# Patient Record
Sex: Male | Born: 1970 | Race: White | Hispanic: No | Smoking: Never smoker
Health system: Southern US, Community
[De-identification: ages and names within clinical notes are randomized; demographics above are authoritative.]

## PROBLEM LIST (undated history)

## (undated) DIAGNOSIS — F419 Anxiety disorder, unspecified: Secondary | ICD-10-CM

## (undated) DIAGNOSIS — C439 Malignant melanoma of skin, unspecified: Secondary | ICD-10-CM

## (undated) DIAGNOSIS — R5383 Other fatigue: Secondary | ICD-10-CM

## (undated) HISTORY — PX: MELANOMA EXCISION: SHX5266

## (undated) HISTORY — DX: Malignant melanoma of skin, unspecified: C43.9

---

## 2016-05-04 ENCOUNTER — Ambulatory Visit (INDEPENDENT_AMBULATORY_CARE_PROVIDER_SITE_OTHER): Payer: BLUE CROSS/BLUE SHIELD

## 2016-05-04 ENCOUNTER — Ambulatory Visit (INDEPENDENT_AMBULATORY_CARE_PROVIDER_SITE_OTHER): Payer: BLUE CROSS/BLUE SHIELD | Admitting: Emergency Medicine

## 2016-05-04 ENCOUNTER — Other Ambulatory Visit: Payer: Self-pay | Admitting: Emergency Medicine

## 2016-05-04 VITALS — BP 112/76 | HR 82 | Temp 98.5°F | Resp 16 | Ht 71.0 in | Wt 206.8 lb

## 2016-05-04 DIAGNOSIS — Z808 Family history of malignant neoplasm of other organs or systems: Secondary | ICD-10-CM

## 2016-05-04 DIAGNOSIS — R200 Anesthesia of skin: Secondary | ICD-10-CM

## 2016-05-04 DIAGNOSIS — F411 Generalized anxiety disorder: Secondary | ICD-10-CM | POA: Diagnosis not present

## 2016-05-04 DIAGNOSIS — D72829 Elevated white blood cell count, unspecified: Secondary | ICD-10-CM

## 2016-05-04 DIAGNOSIS — Z13220 Encounter for screening for lipoid disorders: Secondary | ICD-10-CM

## 2016-05-04 LAB — POCT CBC
GRANULOCYTE PERCENT: 85.6 % — AB (ref 37–80)
HCT, POC: 47.2 % (ref 43.5–53.7)
HEMOGLOBIN: 15.3 g/dL (ref 14.1–18.1)
LYMPH, POC: 0.9 (ref 0.6–3.4)
MCH, POC: 30.6 pg (ref 27–31.2)
MCHC: 34.5 g/dL (ref 31.8–35.4)
MCV: 88.7 fL (ref 80–97)
MID (cbc): 0.6 (ref 0–0.9)
MPV: 7.9 fL (ref 0–99.8)
PLATELET COUNT, POC: 237 10*3/uL (ref 142–424)
POC GRANULOCYTE: 8.9 — AB (ref 2–6.9)
POC LYMPH %: 9.1 % — AB (ref 10–50)
POC MID %: 5.3 %M (ref 0–12)
RBC: 5.32 M/uL (ref 4.69–6.13)
RDW, POC: 12.9 %
WBC: 10.4 10*3/uL — AB (ref 4.6–10.2)

## 2016-05-04 LAB — LIPID PANEL
CHOL/HDL RATIO: 2.9 ratio (ref <5.0)
Cholesterol: 176 mg/dL (ref <200)
HDL: 60 mg/dL (ref >40)
LDL CALC: 102 mg/dL — AB (ref <100)
Triglycerides: 70 mg/dL (ref <150)
VLDL: 14 mg/dL (ref <30)

## 2016-05-04 LAB — TSH: TSH: 1.85 m[IU]/L (ref 0.40–4.50)

## 2016-05-04 LAB — COMPLETE METABOLIC PANEL WITH GFR
ALBUMIN: 4.6 g/dL (ref 3.6–5.1)
ALK PHOS: 47 U/L (ref 40–115)
ALT: 24 U/L (ref 9–46)
AST: 17 U/L (ref 10–40)
BUN: 8 mg/dL (ref 7–25)
CO2: 26 mmol/L (ref 20–31)
Calcium: 9.6 mg/dL (ref 8.6–10.3)
Chloride: 102 mmol/L (ref 98–110)
Creat: 0.93 mg/dL (ref 0.60–1.35)
GFR, Est African American: >89 mL/min (ref >=60)
GLUCOSE: 92 mg/dL (ref 65–99)
POTASSIUM: 3.9 mmol/L (ref 3.5–5.3)
SODIUM: 137 mmol/L (ref 135–146)
Total Bilirubin: 2.2 mg/dL — ABNORMAL HIGH (ref 0.2–1.2)
Total Protein: 7.4 g/dL (ref 6.1–8.1)

## 2016-05-04 NOTE — Patient Instructions (Addendum)
I will call you with results of your blood work. I have made a referral for a mole check because of your family history of melanoma.     IF you received an x-ray today, you will receive an invoice from [facility]. Please contact [facility] at [phone] with questions or concerns regarding your invoice.   IF you received labwork today, you will receive an invoice from [employer]. Please contact [facility] at [phone] with questions or concerns regarding your invoice.   Our billing staff will not be able to assist you with questions regarding bills from these companies.  You will be contacted with the lab results as soon as they are available. The fastest way to get your results is to activate your My Chart account. Instructions are located on the last page of this paperwork. If you have not heard from [place] regarding the results in 2 weeks, please contact this office.

## 2016-05-04 NOTE — Progress Notes (Addendum)
Patient ID: Richard Fisher, male   DOB: 1971-03-01, 45 y.o.   MRN: [id number]    By signing my name below, I, [name], attest that this documentation has been prepared under the direction and in the presence of Darlyne Russian, MD Electronically Signed: Ladene Artist, ED Scribe 05/04/2016 at 4:17 PM.  Chief Complaint:  Chief Complaint  Patient presents with  . lump under arm    Notices it when he is sleeping   . Numbness    Intermitting numbness in left arm  . Mass    Two lumps on RLQ of abdomen   HPI: Richard Fisher is a 45 y.o. male who reports to [facility] today complaining of a non-tender "lump" under his left axillar first noticed in April. Pt states that he initially noticed the lump while he was lying on his left side and sleeping. He reports a pulsating sensation to the lump as well and states that he felt as though he lost muscle mass on his left side. Pt also noticed intermittent itching to the left nipple that resolved with cortisone cream, Benadryl and scratching the area. He denies pain to the area. In October, pt noticed intermittent numbness and pain in his left arm that woke him from his sleep that eventually self-resolved. Pt reports that he was lying on his left side when he noticed the pain. He also states that he was sitting at a bar a few weeks ago when his left arm went numb again, near the tricep. Since then, pt has noticed random episodes of tingling sensations in his posterior legs and ribs. Pt also states that he recently noticed 2 non-tender masses to the RLQ abdomen a few days ago. Pt admits to ordering modafinil online and suspects that symptoms could be from this. He denies a h/o anxiety but states that he is really stressed out and suspects that he may have lymphoma despite a family hx. He also states that he has not been to a doctor in approximately 30 years.  Pt is a Gaffer.   No past medical history on file. No past surgical history on file. Social  History   Social History  . Marital status: Divorced    Spouse name: N/A  . Number of children: N/A  . Years of education: N/A   Social History Main Topics  . Smoking status: Never Smoker  . Smokeless tobacco: Never Used  . Alcohol use No  . Drug use: No  . Sexual activity: Not on file   Other Topics Concern  . Not on file   Social History Narrative  . No narrative on file   No family history on file. Allergies  Allergen Reactions  . Amoxicillin Hives   Prior to Admission medications   Medication Sig Start Date End Date Taking? Authorizing Provider  sildenafil (VIAGRA) 100 MG tablet Take 100 mg by mouth daily as needed for erectile dysfunction.   Yes Historical Provider, MD   ROS: The patient denies fevers, chills, night sweats, unintentional weight loss, chest pain, palpitations, wheezing, dyspnea on exertion, nausea, vomiting, abdominal pain, dysuria, hematuria, melena, numbness, weakness, or tingling.   All other systems have been reviewed and were otherwise negative with the exception of those mentioned in the HPI and as above.    PHYSICAL EXAM: Vitals:   05/04/16 1553  BP: 112/76  Pulse: 82  Resp: 16  Temp: 98.5 F (36.9 C)   Body mass index is 28.84 kg/m.  General: Alert, no acute  distress HEENT:  Normocephalic, atraumatic, oropharynx patent. Eye: Juliette Mangle [facility] Cardiovascular:  Regular rate and rhythm, no rubs murmurs or gallops. No Carotid bruits, radial pulse intact. No pedal edema.  Respiratory: Clear to auscultation bilaterally.  No wheezes, rales, or rhonchi. No cyanosis, no use of accessory musculature Abdominal: No organomegaly, abdomen is soft and non-tender, positive bowel sounds.  No masses. Musculoskeletal: Gait intact. No edema, tenderness Skin: No rashes. Neurologic: Facial musculature symmetric. Psychiatric: Patient acts appropriately throughout our interaction. Lymphatic: No cervical or submandibular lymphadenopathy  LABS: Results for  orders placed or performed in visit on 05/04/16  POCT CBC  Result Value Ref Range   WBC 10.4 (A) 4.6 - 10.2 K/uL   Lymph, poc 0.9 0.6 - 3.4   POC LYMPH PERCENT 9.1 (A) 10 - 50 %L   MID (cbc) 0.6 0 - 0.9   POC MID % 5.3 0 - 12 %M   POC Granulocyte 8.9 (A) 2 - 6.9   Granulocyte percent 85.6 (A) 37 - 80 %G   RBC 5.32 4.69 - 6.13 M/uL   Hemoglobin 15.3 14.1 - 18.1 g/dL   HCT, POC 47.2 43.5 - 53.7 %   MCV 88.7 80 - 97 fL   MCH, POC 30.6 27 - 31.2 pg   MCHC 34.5 31.8 - 35.4 g/dL   RDW, POC 12.9 %   Platelet Count, POC 237 142 - 424 K/uL   MPV 7.9 0 - 99.8 fL   EKG/XRAY:   Primary read interpreted by Dr. [name] at [facility]. Dg Chest 2 View  Result Date: 05/04/2016 CLINICAL DATA:  45 y/o  M; arm numbness. EXAM: CHEST  2 VIEW COMPARISON:  None. FINDINGS: The heart size and mediastinal contours are within normal limits. Both lungs are clear. The visualized skeletal structures are unremarkable. IMPRESSION: No active cardiopulmonary disease. Electronically Signed   By: Kristine Garbe M.D.   On: 05/04/2016 17:01   Dg Cervical Spine 2 Or 3 Views  Result Date: 05/04/2016 CLINICAL DATA:  Arm numbness.  Laterality not specified. EXAM: CERVICAL SPINE - 2-3 VIEW COMPARISON:  None. FINDINGS: The cervical vertebral bodies are normally aligned. Disc spaces and vertebral bodies are maintained. No significant degenerative changes. No acute bony findings or abnormal prevertebral soft tissue swelling. The facets are normally aligned. The neural foramen are patent. The C1-2 articulations are maintained. Small cervical ribs noted. The lung apices are clear. IMPRESSION: Normal alignment and no acute bony findings or significant degenerative changes. Electronically Signed   By: Marijo Sanes M.D.   On: 05/04/2016 17:02    ASSESSMENT/PLAN: No abnormalities noted on initial evaluation. We'll go ahead and add routine labs along with a path review on his white count patient hasn't significant anxiety and was  concerned about lymphoma. I added a path review. His white count was only minimally elevated. No medications were prescribed.I personally performed the services described in this documentation, which was scribed in my presence. The recorded information has been reviewed and is accurate. I did make referral for him to see the dermatologist..   Gross sideeffects, risk and benefits, and alternatives of medications d/w patient. Patient is aware that all medications have potential sideeffects and we are unable to predict every sideeffect or drug-drug interaction that may occur.  Arlyss Queen MD 05/04/2016 4:17 PM

## 2016-05-05 LAB — LACTATE DEHYDROGENASE: LDH: 164 U/L (ref 100–220)

## 2016-05-05 LAB — SEDIMENTATION RATE: Sed Rate: 1 mm/hr (ref 0–15)

## 2016-05-07 LAB — BILIRUBIN, FRACTIONATED(TOT/DIR/INDIR)
BILIRUBIN INDIRECT: 1.3 mg/dL — AB (ref 0.2–1.2)
Bilirubin, Direct: 0.3 mg/dL — ABNORMAL HIGH (ref <=0.2)
Total Bilirubin: 1.6 mg/dL — ABNORMAL HIGH (ref 0.2–1.2)

## 2016-05-07 LAB — PATHOLOGIST SMEAR REVIEW

## 2016-05-15 ENCOUNTER — Ambulatory Visit (INDEPENDENT_AMBULATORY_CARE_PROVIDER_SITE_OTHER): Payer: BLUE CROSS/BLUE SHIELD | Admitting: Family Medicine

## 2016-05-15 VITALS — BP 136/88 | HR 64 | Temp 98.2°F | Resp 16 | Ht 71.0 in | Wt 212.8 lb

## 2016-05-15 DIAGNOSIS — R17 Unspecified jaundice: Secondary | ICD-10-CM

## 2016-05-15 DIAGNOSIS — R208 Other disturbances of skin sensation: Secondary | ICD-10-CM

## 2016-05-15 DIAGNOSIS — G4719 Other hypersomnia: Secondary | ICD-10-CM | POA: Diagnosis not present

## 2016-05-15 DIAGNOSIS — R5383 Other fatigue: Secondary | ICD-10-CM

## 2016-05-15 DIAGNOSIS — R0683 Snoring: Secondary | ICD-10-CM

## 2016-05-15 NOTE — Patient Instructions (Addendum)
  For elevated bilirubin - can recheck that test in next 3-4 weeks.  I will refer you for sleep testing/eval initially for sleepiness during the day. Avoid modafinil for now. Depending on those findings, may also need to meet with neurologist to discuss various areas of "electrical shock" sensation. If any worsening of those symptoms sooner - return to discuss further as may need neurology evaluation sooner.   Follow up in next 1 month for repeat bilirubin, blood counts, possible testosterone level, and vitamin D level.   Other than vitamin D (around 1000units per day), it may be worth stopping other herbal supplements and vitamins for now to see if those may be contributing to some of your symptoms.   Avoid using any prescription medication without consulting a medical provider first.   Return to the clinic or go to the nearest emergency room if any of your symptoms worsen or new symptoms occur.    IF you received an x-ray today, you will receive an invoice from [facility]. Please contact [facility] at [phone] with questions or concerns regarding your invoice.   IF you received labwork today, you will receive an invoice from [employer]. Please contact [facility] at [phone] with questions or concerns regarding your invoice.   Our billing staff will not be able to assist you with questions regarding bills from these companies.  You will be contacted with the lab results as soon as they are available. The fastest way to get your results is to activate your My Chart account. Instructions are located on the last page of this paperwork. If you have not heard from [place] regarding the results in 2 weeks, please contact this office.

## 2016-05-15 NOTE — Progress Notes (Signed)
Subjective:  By signing my name below, I, Richard Fisher, attest that this documentation has been prepared under the direction and in the presence of Richard Ray, MD. Electronically Signed: [name], [place] [facility]. 05/15/2016 , 6:19 PM .  Patient was seen in Room 12 .   Patient ID: Richard Fisher, male    DOB: 1971-05-25, 45 y.o.   MRN: [id number] Chief Complaint  Patient presents with  . Follow-up    per daub   HPI Moss Richard Fisher is a 45 y.o. male  Patient was most recently seen by Dr. Everlene Fisher on Dec 1st. At that time, he reported to have a lump under his left axilla since April. He also had subjective decreased muscle mass on that left side. He also notes intermittent itching around his nipple area, which was resolved with cortisone cream. He also mentions numbness and pain in his left arm in Oct, which resolved on its own. He reported similar symptoms when his arm went numb a week prior at the left tricep and random tingling sensations at posterior legs and ribs.   He did admit to some stress and concern for lymphoma. There was no cervical or submandibular lymphadenopathy on exam. He had chest xray that was negative for cardiopulmonary disease, cervical spine was normal alignment, no acute bony findings or significant degenerative changes. He had borderline WBC of 10.4 on CBC.   He was referred to dermatology for family history of melanoma and had multiple nevi on extremities and trunk. Pathology review of Fisher count showed myeloid population predominantly mature neutrophils with a few reactive lymphocytes, RBC and platelets unremarkable.   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 He had normal TSH and normal sed rate. He informs that he was fasting for a week at last visit.    Fatigue He reports self prescribed taking modafinil intermittently as needed for the past few years to stay alert and stay awake. He stopped in June because he was starting to have other symptoms, as mentioned during visit with Dr. Everlene Fisher. He states needing to take 1~2 hour naps during the day since high school. He would sleep well at night, about 8~9 hours. He would wake up energized, but would crash at a certain time. He has never tested for OSA. He notes some snoring at night when sleeping.   Itchy Nipple He had itching around the nipple area while on modafinil. It resolved when he stopped taking modafinil. Most recently, he started using cortisone cream for relief.   Left axilla lump When he lays on his left side at night, he would feel like he's laying on a lump/sack. He felt his heart beating through the sack. When it really bothered him, he would sleep on the right side.   Abdominal lump He noticed a lump in his mid-abdomen 1 week prior to be seen by Dr. Everlene Fisher. He was informed that it was a fatty liver deposit.   Erectile Dysfunction He used androgel packs in the past. He self prescribed it from the internet. He was using it between January through April. He also takes viagra 13m occasionally.   Fasting He fasts sporadically when he feels necessary. He would do it once every few months.   Tingling down legs He complains having intermittent tingling  down his legs ongoing for 3 weeks, mostly occurs when he lays on his stomach at night. He has been going to the gym daily, and plays basketball. He denies any weakness in his lower extremities.   Vitamin D He believes he takes Vitamin D 10k once a day.   Depression He denies history of depression and anxiety. He denies family history of anxiety and depression in his parents.   Work He works as a Occupational psychologist.   There are no active problems to display for this patient.  No past medical history on file. No past surgical  history on file. Allergies  Allergen Reactions  . Amoxicillin Hives   Prior to Admission medications   Medication Sig Start Date End Date Taking? Authorizing Provider  sildenafil (VIAGRA) 100 MG tablet Take 100 mg by mouth daily as needed for erectile dysfunction.   Yes Historical Provider, MD   Social History   Social History  . Marital status: Divorced    Spouse name: N/A  . Number of children: N/A  . Years of education: N/A   Occupational History  . Not on file.   Social History Main Topics  . Smoking status: Never Smoker  . Smokeless tobacco: Never Used  . Alcohol use No  . Drug use: No  . Sexual activity: Yes   Other Topics Concern  . Not on file   Social History Narrative  . No narrative on file   Review of Systems  Constitutional: Positive for fatigue. Negative for unexpected weight change.  Eyes: Negative for visual disturbance.  Respiratory: Negative for cough, chest tightness and shortness of breath.   Cardiovascular: Negative for chest pain, palpitations and leg swelling.  Gastrointestinal: Negative for abdominal pain and Fisher in stool.  Neurological: Positive for numbness. Negative for dizziness, weakness, light-headedness and headaches.  Psychiatric/Behavioral: Positive for sleep disturbance. Negative for self-injury and suicidal ideas. The patient is not nervous/anxious.        Objective:   Physical Exam  Constitutional: He is oriented to person, place, and time. He appears well-developed and well-nourished. No distress.  HENT:  Head: Normocephalic and atraumatic.  Eyes: EOM are normal. Pupils are equal, round, and reactive to light.  Neck: Neck supple.  Cardiovascular: Normal rate.   Pulmonary/Chest: Effort normal. No respiratory distress.  Abdominal: Soft. Bowel sounds are normal. He exhibits no distension. There is no tenderness.  Musculoskeletal: Normal range of motion.  Upper extremities equal strength bilaterally Lower extremities equal  strength bilaterally  Neurological: He is alert and oriented to person, place, and time.  Reflex Scores:      Tricep reflexes are 2+ on the right side and 2+ on the left side.      Bicep reflexes are 2+ on the right side and 2+ on the left side.      Brachioradialis reflexes are 2+ on the right side and 2+ on the left side.      Patellar reflexes are 2+ on the right side and 2+ on the left side.      Achilles reflexes are 2+ on the right side and 2+ on the left side. No pronator drift  Skin: Skin is warm and dry.  Psychiatric: He has a normal mood and affect. His behavior is normal.  Nursing note and vitals reviewed.   Vitals:   05/15/16 1725  BP: 136/88  Pulse: 64  Resp: 16  Temp: 98.2 F (36.8 C)  TempSrc: Oral  SpO2: 99%  Weight: 212 lb 12.8  oz (96.5 kg)  Height: 5' 11"  (1.803 m)      Assessment & Plan:  Sadiq Mccauley is a 45 y.o. male Snoring - Plan: Ambulatory referral to Sleep Studies Excessive daytime sleepiness - Plan: Ambulatory referral to Sleep Studies Dysesthesia Fatigue, unspecified type  - Multiple above concerns may all be related to inadequate sleep or possible underlying sleep apnea. Denies any manic symptoms or other depression/anxiety symptoms.   -Start with evaluation by sleep specialist, then may need further evaluation with neurology if persistent dysesthesias in various areas.   - Consider testosterone, vitamin D testing at follow-up visit in approximately 1 month if still having fatigue.  - Other than vitamin D, recommended stopping some of the over-the-counter supplements to see if this may be contributing to his symptoms. Additionally advised to not use any prescription medicine unless that had been discussed/recommended by his medical care provider.    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 Plan on repeat CBC, CMP, possible reticulocyte count at follow-up visit. Of note he was fasting prior to previous visit which may have increased  bilirubin. RTC precautions  No orders of the defined types were placed in this encounter.  Patient Instructions    For elevated bilirubin - can recheck that test in next 3-4 weeks.  I will refer you for sleep testing/eval initially for sleepiness during the day. Avoid modafinil for now. Depending on those findings, may also need to meet with neurologist to discuss various areas of "electrical shock" sensation. If any worsening of those symptoms sooner - return to discuss further as may need neurology evaluation sooner.   Follow up in next 1 month for repeat bilirubin, Fisher counts, possible testosterone level, and vitamin D level.   Other than vitamin D (around 1000units per day), it may be worth stopping other herbal supplements and vitamins for now to see if those may be contributing to some of your symptoms.   Avoid using any prescription medication without consulting a medical provider first.   Return to the clinic or go to the nearest emergency room if any of your symptoms worsen or new symptoms occur.    IF you received an x-Fisher today, you will receive an invoice from [facility]. Please contact [facility] at [phone] with questions or concerns regarding your invoice.   IF you received labwork today, you will receive an invoice from [employer]. Please contact [facility] at [phone] with questions or concerns regarding your invoice.   Our billing staff will not be able to assist you with questions regarding bills from these companies.  You will be contacted with the lab results as soon as they are available. The fastest way to get your results is to activate your My Chart account. Instructions are located on the last page of this paperwork. If you have not heard from [place] regarding the results in 2 weeks, please contact this office.        I personally performed the services described in this documentation, which was scribed in  my presence. The recorded information has been reviewed and considered, and addended by me as needed.   Signed,   Richard Ray, MD [facility].  05/16/16 5:21 PM

## 2016-05-22 DIAGNOSIS — D485 Neoplasm of uncertain behavior of skin: Secondary | ICD-10-CM | POA: Diagnosis not present

## 2016-05-22 DIAGNOSIS — D225 Melanocytic nevi of trunk: Secondary | ICD-10-CM | POA: Diagnosis not present

## 2016-05-22 DIAGNOSIS — L814 Other melanin hyperpigmentation: Secondary | ICD-10-CM | POA: Diagnosis not present

## 2016-05-22 DIAGNOSIS — D0362 Melanoma in situ of left upper limb, including shoulder: Secondary | ICD-10-CM | POA: Diagnosis not present

## 2016-05-22 DIAGNOSIS — D235 Other benign neoplasm of skin of trunk: Secondary | ICD-10-CM | POA: Diagnosis not present

## 2016-06-05 DIAGNOSIS — D225 Melanocytic nevi of trunk: Secondary | ICD-10-CM | POA: Diagnosis not present

## 2016-06-05 DIAGNOSIS — Z85820 Personal history of malignant melanoma of skin: Secondary | ICD-10-CM | POA: Diagnosis not present

## 2016-06-05 DIAGNOSIS — L259 Unspecified contact dermatitis, unspecified cause: Secondary | ICD-10-CM | POA: Diagnosis not present

## 2016-06-05 DIAGNOSIS — L905 Scar conditions and fibrosis of skin: Secondary | ICD-10-CM | POA: Diagnosis not present

## 2016-06-05 DIAGNOSIS — D485 Neoplasm of uncertain behavior of skin: Secondary | ICD-10-CM | POA: Diagnosis not present

## 2016-06-14 ENCOUNTER — Ambulatory Visit (INDEPENDENT_AMBULATORY_CARE_PROVIDER_SITE_OTHER): Payer: BLUE CROSS/BLUE SHIELD | Admitting: Neurology

## 2016-06-14 ENCOUNTER — Encounter: Payer: Self-pay | Admitting: Family Medicine

## 2016-06-14 ENCOUNTER — Ambulatory Visit (INDEPENDENT_AMBULATORY_CARE_PROVIDER_SITE_OTHER): Payer: BLUE CROSS/BLUE SHIELD | Admitting: Family Medicine

## 2016-06-14 ENCOUNTER — Encounter: Payer: Self-pay | Admitting: Neurology

## 2016-06-14 VITALS — BP 136/72 | HR 72 | Resp 16 | Ht 71.0 in | Wt 207.0 lb

## 2016-06-14 VITALS — BP 129/76 | HR 58 | Temp 98.3°F | Resp 16 | Ht 71.0 in | Wt 210.0 lb

## 2016-06-14 DIAGNOSIS — R400 Somnolence: Secondary | ICD-10-CM

## 2016-06-14 DIAGNOSIS — R17 Unspecified jaundice: Secondary | ICD-10-CM | POA: Diagnosis not present

## 2016-06-14 DIAGNOSIS — R5383 Other fatigue: Secondary | ICD-10-CM

## 2016-06-14 DIAGNOSIS — D72829 Elevated white blood cell count, unspecified: Secondary | ICD-10-CM

## 2016-06-14 DIAGNOSIS — G478 Other sleep disorders: Secondary | ICD-10-CM

## 2016-06-14 DIAGNOSIS — G4710 Hypersomnia, unspecified: Secondary | ICD-10-CM | POA: Diagnosis not present

## 2016-06-14 NOTE — Patient Instructions (Addendum)
  Keep follow up with sleep specialist today. I will check other tests for fatigue and bilirubin, but I suspect Gilbert's as cause.   If tingling in legs returns, return to discuss further. It may have been due to one of your supplements previously.    IF you received an x-ray today, you will receive an invoice from [facility]. Please contact [facility] at [phone] with questions or concerns regarding your invoice.   IF you received labwork today, you will receive an invoice from [facility]. Please contact [facility] at [phone] with questions or concerns regarding your invoice.   Our billing staff will not be able to assist you with questions regarding bills from these companies.  You will be contacted with the lab results as soon as they are available. The fastest way to get your results is to activate your My Chart account. Instructions are located on the last page of this paperwork. If you have not heard from [place] regarding the results in 2 weeks, please contact this office.

## 2016-06-14 NOTE — Progress Notes (Signed)
By signing my name below I, Tereasa Coop, attest that this documentation has been prepared under the direction and in the presence of [name], MD. Electonically Signed. Tereasa Coop, Scribe 06/14/2016 at 10:16 AM  Subjective:    Patient ID: Richard Fisher, male    DOB: Jun 15, 1970, 46 y.o.   MRN: [id number]  Chief Complaint  Patient presents with  . Follow-up    blood work for testosterone     HPI Richard Fisher is a 46 y.o. male who presents to the [facility] for follow up reevaluation of fatigue.   Fatigue Here today for testosterone and vit D level testing. Last seen on 05/15/16 for multiple complaints. See details of last visit. Primarily discussed fatigue and concern for possible OSA at last visit. Referred to sleep specialist, but was unable to obtain an appointment based on referral notes. Considered testosterone and vit D testing. Recommended discontinuing his OTC vitamin supplements to see if that contributed to his fatigue. Also had been using androgel that pt bought through the Internet. Was advised to discontinue any medications not prescribed by a medical provider. Today pt states that he still has fatigue. Fatigue has been unchanged since he was a teenager. Is going to [facility] neurologic today after current visit. Pt reports that he discontinued all non-prescribed medications and OTC supplements. Has been taking the recommended vit D supplements sporadically. Also reports that he has switched to a vegan diet.  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 Planned for repeat labs today.   Pt was also reporting tingling down his legs at last visit. Today, pt reports tingling has resolved since he changed his diet and discontinued his supplements.   There are no active problems to display for this patient.  History reviewed. No pertinent past medical history. History reviewed. No pertinent surgical history. Allergies  Allergen  Reactions  . Amoxicillin Hives   Prior to Admission medications   Medication Sig Start Date End Date Taking? Authorizing Provider  sildenafil (VIAGRA) 100 MG tablet Take 100 mg by mouth daily as needed for erectile dysfunction.    Historical Provider, MD   Social History   Social History  . Marital status: Divorced    Spouse name: N/A  . Number of children: N/A  . Years of education: N/A   Occupational History  . Not on file.   Social History Main Topics  . Smoking status: Never Smoker  . Smokeless tobacco: Never Used  . Alcohol use No  . Drug use: No  . Sexual activity: Yes   Other Topics Concern  . Not on file   Social History Narrative  . No narrative on file      Review of Systems  Constitutional: Positive for fatigue. Negative for unexpected weight change.  Eyes: Negative for visual disturbance.  Respiratory: Negative for cough, chest tightness and shortness of breath.   Cardiovascular: Negative for chest pain, palpitations and leg swelling.  Gastrointestinal: Negative for abdominal pain and blood in stool.  Neurological: Negative for dizziness, light-headedness, numbness and headaches.       Objective:   Physical Exam  Constitutional: He is oriented to person, place, and time. He appears well-developed and well-nourished.  HENT:  Head: Normocephalic and atraumatic.  Eyes: EOM are normal. Pupils are equal, round, and reactive to light.  Neck: No JVD present. Carotid bruit is not present. No thyroid mass and no thyromegaly present.  Cardiovascular: Normal rate, regular rhythm and normal heart  sounds.   No murmur heard. Pulmonary/Chest: Effort normal and breath sounds normal. He has no rales.  Musculoskeletal: He exhibits no edema.  Neurological: He is alert and oriented to person, place, and time.  Skin: Skin is warm and dry.  Psychiatric: He has a normal mood and affect.  Vitals reviewed.    Vitals:   06/14/16 0927  BP: 129/76  Pulse: (!) 58    Resp: 16  Temp: 98.3 F (36.8 C)  TempSrc: Oral  SpO2: 100%  Weight: 210 lb (95.3 kg)  Height: 5' 11"  (1.803 m)         Assessment & Plan:  Richard Fisher is a 46 y.o. 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  Fatigue, unspecified type - Plan: Vitamin D 1,25 dihydroxy, Testosterone Daytime sleepiness  -Follow-up as planned for sleep study/discuss with sleep medicine specialist. Check vitamin D, testosterone level.  -Recommended to continue avoiding over-the-counter supplements for now including testosterone until levels checked.  Leukocytosis, unspecified type - Plan: CBC, Care order/instruction:  - Afebrile, repeat CBC.  No orders of the defined types were placed in this encounter.  Patient Instructions    Keep follow up with sleep specialist today. I will check other tests for fatigue and bilirubin, but I suspect Gilbert's as cause.   If tingling in legs returns, return to discuss further. It may have been due to one of your supplements previously.    IF you received an x-ray today, you will receive an invoice from [facility]. Please contact [facility] at [phone] with questions or concerns regarding your invoice.   IF you received labwork today, you will receive an invoice from [facility]. Please contact [facility] at [phone] with questions or concerns regarding your invoice.   Our billing staff will not be able to assist you with questions regarding bills from these companies.  You will be contacted with the lab results as soon as they are available. The fastest way to get your results is to activate your My Chart account. Instructions are located on the last page of this paperwork. If you have not heard from [place] regarding the results in 2 weeks, please contact this  office.        I personally performed the services described in this documentation, which was scribed in my presence. The recorded information has been reviewed and considered, and addended by me as needed.   Signed,   [name], MD [facility].  06/15/16 7:08 PM

## 2016-06-14 NOTE — Progress Notes (Addendum)
Subjective:    Patient ID: Richard Fisher is a 46 y.o. male.  HPI     Star Age, MD, PhD [facility] [address] [place], [place] [address]  Dear Dr. Carlota Raspberry,   I saw your patient, Richard Fisher, upon your kind request in my neurologic clinic today for initial consultation of his sleep disorder, in particular, concern for underlying obstructive sleep apnea. The patient is unaccompanied today. As you know, Richard Fisher is a 46 year old right-handed gentleman with an underlying medical history of ED, overweight state and skin cancer, who reports excessive daytime somnolence his teenage years. I reviewed your office note from 05/15/2016. While his Epworth sleepiness score is reported by his assessment to be only 9 out of 24 today, I think he is underreporting the degree of his sleepiness based on his overall sleep related history. He denies falling asleep at the wheel but has come close. He is a nonsmoker, does not currently drink any alcohol and has used marijuana intermittently. He has 4 children, ages 82, 78 year old twins and a 80 year old daughter. He lives with his girlfriend and his youngest daughter. He has his own [employer] as I understand. He is not currently working on a daily basis.  Of note, he reports a bedtime between 10 and 11 PM and wakeup time around 5 or 6 AM but he does take prolonged naps, and a daily nap, lasting anywhere from 3:40 or 5 hours. If he had completed leisure he could easily sleep 10-12 hours at night he indicates. His fatigue score is 39 out of 63. 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 He denies hypnagogic or hypnopompic hallucinations and a family history of narcolepsy but interestingly his father has been known to nap every day and his 38 year old brother has a history of taking naps during the day. He has a total of 4 brothers and 1 sister. He denies restless leg symptoms or  leg twitching at night. He denies sleep talking or sleep walking. He does not keep a very scheduled sleep time and wakeup time. He tries to exercise every day and is currently on a vegan diet. He drinks 1 cup of coffee per day. For the past 4 or 5 years he has been utilizing modafinil off and on which he purchased online. He feels that modafinil has been very helpful in helping him stay awake when he needed to. He does endorse falling asleep in class when he was in school. He had blood work on 05/04/2016 which I reviewed, TSH, ESR were unremarkable at the time and he had additional blood work today including CBC, CMP, vitamin D, testosterone level, with results pending. He does not snore typically, denies nocturia, but has had occasional morning headaches which are reportedly mild. He is not aware of any family history of obstructive sleep apnea. He does not report vivid dreams or dreaming a lot. He does sometimes dream during a prolonged nap.  His Past Medical History Is Significant For: Past Medical History:  Diagnosis Date  . Melanoma ([name])     His Past Surgical History Is Significant For: No past surgical history on file.  His Family History Is Significant For: No family history on file.  His Social History Is Significant For: Social History   Social History  . Marital status: Divorced    Spouse name: N/A  . Number of children: 4  . Years of education: BS   Social History  Main Topics  . Smoking status: Never Smoker  . Smokeless tobacco: Never Used  . Alcohol use No  . Drug use: No  . Sexual activity: Yes   Other Topics Concern  . None   Social History Narrative   Drinks 1 caffeine drink a day     His Allergies Are:  Allergies  Allergen Reactions  . Amoxicillin Hives  :   His Current Medications Are:  Outpatient Encounter Prescriptions as of 06/14/2016  Medication Sig  . sildenafil (VIAGRA) 100 MG tablet Take 100 mg by mouth daily as needed for erectile dysfunction.    No facility-administered encounter medications on file as of 06/14/2016.   : Review of Systems:  Out of a complete 14 point review of systems, all are reviewed and negative with the exception of these symptoms as listed below:  Review of Systems  Neurological:       No trouble falling and staying asleep, daytime fatigue, takes naps.  Patient has taken modafinil in the past, states that it helped with his daytime fatigue.    Epworth Sleepiness Scale 0= would never doze 1= slight chance of dozing 2= moderate chance of dozing 3= high chance of dozing  Sitting and reading:1 Watching TV:1 Sitting inactive in a public place (ex. Theater or meeting):1 As a passenger in a car for an hour without a break:1 Lying down to rest in the afternoon:3 Sitting and talking to someone:0 Sitting quietly after lunch (no alcohol):2 In a car, while stopped in traffic:0 Total:9  Objective:  Neurologic Exam  Physical Exam Physical Examination:   Vitals:   06/14/16 1120  BP: 136/72  Pulse: 72  Resp: 16   General Examination: The patient is a very pleasant 46 y.o. male in no acute distress. He appears well-developed and well-nourished and well groomed.   HEENT: Normocephalic, atraumatic, pupils are equal, round and reactive to light and accommodation. Funduscopic exam is normal with sharp disc margins noted. Extraocular tracking is good without limitation to gaze excursion or nystagmus noted. Normal smooth pursuit is noted. Hearing is grossly intact. Face is symmetric with normal facial animation and normal facial sensation. Speech is clear with no dysarthria noted. There is no hypophonia. There is no lip, neck/head, jaw or voice tremor. Neck is supple with full range of passive and active motion. There are no carotid bruits on auscultation. Oropharynx exam reveals: mild mouth dryness, good dental hygiene and mild airway crowding, due to floppy soft palate and tonsils in place, Mallampati is class I.  Tongue protrudes centrally and palate elevates symmetrically. Tonsils are 1+ in size. Neck size is 16.25 inches. He has a Mild overbite.   Chest: Clear to auscultation without wheezing, rhonchi or crackles noted.  Heart: S1+S2+0, regular and normal without murmurs, rubs or gallops noted.   Abdomen: Soft, non-tender and non-distended with normal bowel sounds appreciated on auscultation.  Extremities: There is no pitting edema in the distal lower extremities bilaterally. Pedal pulses are intact.  Skin: Warm and dry without trophic changes noted.   Musculoskeletal: exam reveals no obvious joint deformities, tenderness or joint swelling or erythema.   Neurologically:  Mental status: The patient is awake, alert and oriented in all 4 spheres. His immediate and remote memory, attention, language skills and fund of knowledge are appropriate. There is no evidence of aphasia, agnosia, apraxia or anomia. Speech is clear with normal prosody and enunciation. Thought process is linear. Mood is normal and affect is normal.  Cranial nerves II - XII  are as described above under HEENT exam. In addition: shoulder shrug is normal with equal shoulder height noted. Motor exam: Normal bulk, strength and tone is noted. There is no drift, tremor or rebound. Romberg is negative. Reflexes are 2+ throughout. Babinski: Toes are flexor bilaterally. Fine motor skills and coordination: intact with normal finger taps, normal hand movements, normal rapid alternating patting, normal foot taps and normal foot agility.  Cerebellar testing: No dysmetria or intention tremor on finger to nose testing. Heel to shin is unremarkable bilaterally. There is no truncal or gait ataxia.  Sensory exam: intact to light touch and temperature sense in the upper and lower extremities.  Gait, station and balance: He stands easily. No veering to one side is noted. No leaning to one side is noted. Posture is age-appropriate and stance is narrow based.  Gait shows normal stride length and normal pace. No problems turning are noted. Tandem walk is unremarkable.  Assessment and Plan:   In summary, Richard Fisher is a very pleasant 46 y.o.-year old male who presents for initial consultation of a long-standing history of excessive daytime somnolence and symptomatic improvement with self obtained modafinil. His physical exam and neurological exam are nonfocal. His history and exam are not telltale for obstructive sleep apnea. Nevertheless, I do believe he has an underlying significant degree of sleepiness, and further diagnostic testing is warranted. I have advised patient to refrain from taking modafinil and especially in preparation for sleep study testing he is advised to be off of any prescription or nonprescription psychotropic medications and including Marijuana.  I had a long chat with the patient about my findings and the differential diagnoses of excessive sleepiness. The 2 main common differential diagnoses are idiopathic hypersomnolence versus narcolepsy without cataplexy. I would like to proceed with a nocturnal polysomnogram followed by an MSLT/nap study the next day. We also briefly talked about utilizing medication potentially in the near future after sleep testing is completed. We did talk about utilizing stimulant or non-stimulant type wake promoting medications and also medication for narcolepsy briefly, we will pick up our discussion after his testing is completed. I answered all his questions today and the patient was in agreement with the plan. Thank you very much for allowing me to participate in the care of this nice patient. If I can be of any further assistance to you please do not hesitate to call me at [phone].  Sincerely,   Star Age, MD, PhD

## 2016-06-14 NOTE — Patient Instructions (Addendum)
Please refrain from taking Provigil at this time. In particular, NO modafinil for at least 2 weeks prior to sleep testing.   I believe you may have an underlying sleepiness condition. This means, that you may have a sleep disorder that manifests with excessive sleep and excessive sleepiness during the day.  We will do additional testing at this point: I would like for you to come back for an overnight sleep study during which we will monitor your night time sleep and we will do nap study testing the next day: 5 scheduled 20 min nap opportunities, every 2 hours. We will remind you to stay awake in between naps.   As explained, you will have to be off of any caffeine or stimulant or antidepressant medication in preparation for the sleep studies.    Please remember to try to maintain good sleep hygiene, which means: Keep a regular sleep and wake schedule, try not to exercise or have a meal within 2 hours of your bedtime, try to keep your bedroom conducive for sleep, that is, cool and dark, without light distractors such as an illuminated alarm clock, and refrain from watching TV right before sleep or in the middle of the night and do not keep the TV or radio on during the night. Also, try not to use or play on electronic devices at bedtime, such as your cell phone, tablet PC or laptop. If you like to read at bedtime on an electronic device, try to dim the background light as much as possible. Do not eat in the middle of the night.    I will likely see you back after your sleep study to go over the test results and where to go from there. We will call you after your sleep study to advise about the results (most likely, you will hear from Beverlee Nims, my nurse) and to set up an appointment at the time, as necessary.    Our sleep lab administrative assistant, Arrie Aran will meet with you or call you to schedule your sleep study. If you don't hear back from her by next week please feel free to call her at [phone].  This is her direct line and please leave a message with your phone number to call back if you get the voicemail box. She will call back as soon as possible.

## 2016-06-18 LAB — VITAMIN D 1,25 DIHYDROXY
VITAMIN D3 1, 25 (OH): 32 pg/mL
Vitamin D 1, 25 (OH)2 Total: 32 pg/mL
Vitamin D2 1, 25 (OH)2: <10 pg/mL

## 2016-06-18 LAB — TESTOSTERONE: TESTOSTERONE: 641 ng/dL (ref 264–916)

## 2016-06-18 LAB — COMPREHENSIVE METABOLIC PANEL
ALK PHOS: 58 IU/L (ref 39–117)
ALT: 29 IU/L (ref 0–44)
AST: 22 IU/L (ref 0–40)
Albumin/Globulin Ratio: 1.9 (ref 1.2–2.2)
Albumin: 4.8 g/dL (ref 3.5–5.5)
BUN/Creatinine Ratio: 9 (ref 9–20)
BUN: 8 mg/dL (ref 6–24)
Bilirubin Total: 1.1 mg/dL (ref 0.0–1.2)
CALCIUM: 9.8 mg/dL (ref 8.7–10.2)
CO2: 24 mmol/L (ref 18–29)
CREATININE: 0.89 mg/dL (ref 0.76–1.27)
Chloride: 100 mmol/L (ref 96–106)
GFR calc Af Amer: 119 mL/min/{1.73_m2} (ref >59)
GFR calc non Af Amer: 103 mL/min/{1.73_m2} (ref >59)
GLOBULIN, TOTAL: 2.5 g/dL (ref 1.5–4.5)
GLUCOSE: 87 mg/dL (ref 65–99)
Potassium: 4.5 mmol/L (ref 3.5–5.2)
SODIUM: 142 mmol/L (ref 134–144)
Total Protein: 7.3 g/dL (ref 6.0–8.5)

## 2016-06-18 LAB — CBC
Hematocrit: 48.9 % (ref 37.5–51.0)
Hemoglobin: 16.2 g/dL (ref 13.0–17.7)
MCH: 29.5 pg (ref 26.6–33.0)
MCHC: 33.1 g/dL (ref 31.5–35.7)
MCV: 89 fL (ref 79–97)
PLATELETS: 266 10*3/uL (ref 150–379)
RBC: 5.49 x10E6/uL (ref 4.14–5.80)
RDW: 14 % (ref 12.3–15.4)
WBC: 6.2 10*3/uL (ref 3.4–10.8)

## 2016-06-18 LAB — RETICULOCYTES: RETIC CT PCT: 1 % (ref 0.6–2.6)

## 2016-06-27 DIAGNOSIS — D0362 Melanoma in situ of left upper limb, including shoulder: Secondary | ICD-10-CM | POA: Diagnosis not present

## 2016-06-27 DIAGNOSIS — L900 Lichen sclerosus et atrophicus: Secondary | ICD-10-CM | POA: Diagnosis not present

## 2016-07-02 ENCOUNTER — Encounter: Payer: Self-pay | Admitting: Family Medicine

## 2016-07-25 ENCOUNTER — Encounter: Payer: Self-pay | Admitting: Family Medicine

## 2016-08-07 DIAGNOSIS — L821 Other seborrheic keratosis: Secondary | ICD-10-CM | POA: Diagnosis not present

## 2016-08-07 DIAGNOSIS — D1801 Hemangioma of skin and subcutaneous tissue: Secondary | ICD-10-CM | POA: Diagnosis not present

## 2016-08-07 DIAGNOSIS — D235 Other benign neoplasm of skin of trunk: Secondary | ICD-10-CM | POA: Diagnosis not present

## 2016-08-07 DIAGNOSIS — D485 Neoplasm of uncertain behavior of skin: Secondary | ICD-10-CM | POA: Diagnosis not present

## 2016-08-09 ENCOUNTER — Ambulatory Visit (INDEPENDENT_AMBULATORY_CARE_PROVIDER_SITE_OTHER): Payer: BLUE CROSS/BLUE SHIELD | Admitting: Family Medicine

## 2016-08-09 ENCOUNTER — Encounter: Payer: Self-pay | Admitting: Family Medicine

## 2016-08-09 VITALS — BP 126/80 | HR 60 | Temp 98.7°F | Resp 16 | Ht 71.0 in | Wt 209.6 lb

## 2016-08-09 DIAGNOSIS — R591 Generalized enlarged lymph nodes: Secondary | ICD-10-CM

## 2016-08-09 DIAGNOSIS — R198 Other specified symptoms and signs involving the digestive system and abdomen: Secondary | ICD-10-CM

## 2016-08-09 DIAGNOSIS — N509 Disorder of male genital organs, unspecified: Secondary | ICD-10-CM | POA: Diagnosis not present

## 2016-08-09 DIAGNOSIS — H9313 Tinnitus, bilateral: Secondary | ICD-10-CM

## 2016-08-09 DIAGNOSIS — N5089 Other specified disorders of the male genital organs: Secondary | ICD-10-CM

## 2016-08-09 NOTE — Progress Notes (Signed)
By signing my name below, I, [name], attest that this documentation has been prepared under the direction and in the presence of [name], MD.  Electronically Signed: [name], Medical Scribe. 08/09/16. 4:33 PM.  Subjective:    Patient ID: Richard Fisher, male    DOB: [birth year], 46 y.o.   MRN: [id number]  HPI Chief Complaint  Patient presents with  . Facial Swelling    lymph node in face swollen x 1 month     HPI Comments: Richard Fisher is a 46 y.o. male who presents to the [facility] complaining of nodule on the left side of his face and left testicular "weight".  Left face nodule: Pt noticed the nodule Jan 25th and mentions it's gone down slightly. Reports associated sxs of sporadic sound heard in bilateral ears described as a "slight monotone small buzz" onset a few weeks ago. He was seen by a dermatologist and his dentist for his sxs and they didn't find any concerns for his lymph nodes. His dentist told him possible tenderness could be from him clenching his teeth at night. He has a dental guard for prevention. His back wisdom teeth have the beginning of a cavity, but no identified infection. Denies fever, night sweats, unexplained weight loss, ear pain, and ear drainage.  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 He had it surgically removed Jan 24th.   Testicular: Reports left testicular felt "weighty" with some edema. He noticed discomfort when his seat was declined at the movies and found relief when he shifted his leg up for relief of his sxs. Denies penile discomfort or pain, penile discharge. There are no active problems to display for this patient.  Past Medical History:  Diagnosis Date  . Melanoma ([place])    History reviewed. No pertinent surgical history. Allergies  Allergen Reactions  . Amoxicillin Hives   Prior to Admission medications   Medication Sig Start Date  End Date Taking? Authorizing Provider  sildenafil (VIAGRA) 100 MG tablet Take 100 mg by mouth daily as needed for erectile dysfunction.   Yes Historical Provider, MD   Social History   Social History  . Marital status: Divorced    Spouse name: N/A  . Number of children: 4  . Years of education: BS   Occupational History  . Not on file.   Social History Main Topics  . Smoking status: Never Smoker  . Smokeless tobacco: Never Used  . Alcohol use No  . Drug use: No  . Sexual activity: Yes   Other Topics Concern  . Not on file   Social History Narrative   Drinks 1 caffeine drink a day    Review of Systems  Constitutional: Negative for diaphoresis, fever and unexpected weight change.  HENT: Positive for facial swelling (focal nodule) and tinnitus. Negative for ear discharge and ear pain.   Genitourinary: Positive for scrotal swelling. Negative for discharge, penile pain and testicular pain.   Objective:  Physical Exam  Constitutional: He appears well-developed and well-nourished. No distress.  HENT:  Head: Normocephalic and atraumatic.  Left Ear: Tympanic membrane and ear canal normal.  No surround erythema or rash of the ear TMJ on the left was non tender, no clicks  Eyes: Conjunctivae are normal.  Neck: Neck supple.  Cardiovascular: Normal rate.   Pulmonary/Chest: Effort normal.  Genitourinary: No penile tenderness. No discharge found.  Genitourinary Comments: Top of  left testicle he had a cystic appearing sturctute measuring approx 1 cm No erythema No penile discharge No rash Non tender No inguinal adenopathy  Lymphadenopathy:       Head (right side): No occipital adenopathy present.       Head (left side): Preauricular adenopathy present. No occipital adenopathy present.    He has no cervical adenopathy (anterior or appreciable neck adenopathy).       Right: No inguinal and no supraclavicular adenopathy present.       Left: No inguinal and no supraclavicular  adenopathy present.  Possible faintly prominate left preauricular lymphnode, but non tender and it's mobile  Neurological: He is alert.  Skin: Skin is warm and dry.  No abrasions or wounds across his scalp or face  Psychiatric: He has a normal mood and affect. His behavior is normal.  Nursing note and vitals reviewed.   Vitals:   08/09/16 1548  BP: 126/80  Pulse: 60  Resp: 16  Temp: 98.7 F (37.1 C)  TempSrc: Oral  SpO2: 100%  Weight: 209 lb 9.6 oz (95.1 kg)  Height: 5' 11"  (1.803 m)  Body mass index is 29.23 kg/m. Assessment & Plan:  Richard Fisher is a 46 y.o. male Tinnitus of both ears - Plan: Ambulatory referral to ENT Lymphadenopathy - Plan: Ambulatory referral to ENT  - Minimal lymphadenopathy anterior to left anterior, but with complaint of tinnitus as well, will refer to ENT for further evaluation. No concerning findings on exam otherwise.  Teeth clenching  - Continue follow-up with dentist. Do not appreciate any significant tenderness across TMJ, or clicking.   Testicle lump - Plan: US Scrotum  - Check ultrasound for further evaluation of abnormal area. Asymptomatic otherwise, hold on urinary or STI testing at this time.  No orders of the defined types were placed in this encounter.  Patient Instructions    For the bump in front of your ear, I will refer you to ear nose and throat. If the ringing in the ears and not bump has resolved prior to seeing ear nose and throat, can cancel that appointment.  I will schedule an appointment for an ultrasound to evaluate the bump on the left testicle further. As long as that is not painful or any worsening symptoms - okay to have that ultrasound in the next few weeks.  If there is any sign of epididymitis or infection/inflammation of that structure, I would recommend other testing in office. Let me know if you have questions in the meantime.   Tinnitus Tinnitus refers to hearing a sound when there is no actual source for that  sound. This is often described as ringing in the ears. However, people with this condition may hear a variety of noises. A person may hear the sound in one ear or in both ears. The sounds of tinnitus can be soft, loud, or somewhere in between. Tinnitus can last for a few seconds or can be constant for days. It may go away without treatment and come back at various times. When tinnitus is constant or happens often, it can lead to other problems, such as trouble sleeping and trouble concentrating. Almost everyone experiences tinnitus at some point. Tinnitus that is long-lasting (chronic) or comes back often is a problem that may require medical attention. What are the causes? The cause of tinnitus is often not known. In some cases, it can result from other problems or conditions, including:  Exposure to loud noises from machinery, music, or other sources.  Hearing loss.  Ear or sinus infections.  Earwax buildup.  A foreign object in the ear.  Use of certain medicines.  Use of alcohol and caffeine.  High blood pressure.  Heart diseases.  Anemia.  Allergies.  Meniere disease.  Thyroid problems.  Tumors.  An enlarged part of a weakened blood vessel (aneurysm). What are the signs or symptoms? The main symptom of tinnitus is hearing a sound when there is no source for that sound. It may sound like:  Buzzing.  Roaring.  Ringing.  Blowing air, similar to the sound heard when you listen to a seashell.  Hissing.  Whistling.  Sizzling.  Humming.  Running water.  A sustained musical note. How is this diagnosed? Tinnitus is diagnosed based on your symptoms. Your health care provider will do a physical exam. A comprehensive hearing exam (audiologic exam) will be done if your tinnitus:  Affects only one ear (unilateral).  Causes hearing difficulties.  Lasts 6 months or longer. You may also need to see a health care provider who specializes in hearing disorders  (audiologist). You may be asked to complete a questionnaire to determine the severity of your tinnitus. Tests may be done to help determine the cause and to rule out other conditions. These can include:  Imaging studies of your head and brain, such as:  A CT scan.  An MRI.  An imaging study of your blood vessels (angiogram). How is this treated? Treating an underlying medical condition can sometimes make tinnitus go away. If your tinnitus continues, other treatments may include:  Medicines, such as certain antidepressants or sleeping aids.  Sound generators to mask the tinnitus. These include:  Tabletop sound machines that play relaxing sounds to help you fall asleep.  Wearable devices that fit in your ear and play sounds or music.  A small device that uses headphones to deliver a signal embedded in music (acoustic neural stimulation). In time, this may change the pathways of your brain and make you less sensitive to tinnitus. This device is used for very severe cases when no other treatment is working.  Therapy and counseling to help you manage the stress of living with tinnitus.  Using hearing aids or cochlear implants, if your tinnitus is related to hearing loss. Follow these instructions at home:  When possible, avoid being in loud places and being exposed to loud sounds.  Wear hearing protection, such as earplugs, when you are exposed to loud noises.  Do not take stimulants, such as nicotine, alcohol, or caffeine.  Practice techniques for reducing stress, such as meditation, yoga, or deep breathing.  Use a white noise machine, a humidifier, or other devices to mask the sound of tinnitus.  Sleep with your head slightly raised. This may reduce the impact of tinnitus.  Try to get plenty of rest each night. Contact a health care provider if:  You have tinnitus in just one ear.  Your tinnitus continues for 3 weeks or longer without stopping.  Home care measures are not  helping.  You have tinnitus after a head injury.  You have tinnitus along with any of the following:  Dizziness.  Loss of balance.  Nausea and vomiting. This information is not intended to replace advice given to you by your health care provider. Make sure you discuss any questions you have with your health care provider. Document Released: 05/21/2005 Document Revised: 01/22/2016 Document Reviewed: 10/21/2013 Elsevier Interactive Patient Education  2017 [employer].   IF you received an x-ray today,  you will receive an invoice from [facility]. Please contact [facility] at [phone] with questions or concerns regarding your invoice.   IF you received labwork today, you will receive an invoice from [place]. Please contact [facility] at [phone] with questions or concerns regarding your invoice.   Our billing staff will not be able to assist you with questions regarding bills from these companies.  You will be contacted with the lab results as soon as they are available. The fastest way to get your results is to activate your My Chart account. Instructions are located on the last page of this paperwork. If you have not heard from [place] regarding the results in 2 weeks, please contact this office.       I personally performed the services described in this documentation, which was scribed in my presence. The recorded information has been reviewed and considered for accuracy and completeness, addended by me as needed, and agree with information above.  Signed,   [name], MD [facility].  08/11/16 5:16 PM

## 2016-08-09 NOTE — Patient Instructions (Addendum)
For the bump in front of your ear, I will refer you to ear nose and throat. If the ringing in the ears and not bump has resolved prior to seeing ear nose and throat, can cancel that appointment.  I will schedule an appointment for an ultrasound to evaluate the bump on the left testicle further. As long as that is not painful or any worsening symptoms - okay to have that ultrasound in the next few weeks.  If there is any sign of epididymitis or infection/inflammation of that structure, I would recommend other testing in office. Let me know if you have questions in the meantime.   Tinnitus Tinnitus refers to hearing a sound when there is no actual source for that sound. This is often described as ringing in the ears. However, people with this condition may hear a variety of noises. A person may hear the sound in one ear or in both ears. The sounds of tinnitus can be soft, loud, or somewhere in between. Tinnitus can last for a few seconds or can be constant for days. It may go away without treatment and come back at various times. When tinnitus is constant or happens often, it can lead to other problems, such as trouble sleeping and trouble concentrating. Almost everyone experiences tinnitus at some point. Tinnitus that is long-lasting (chronic) or comes back often is a problem that may require medical attention. What are the causes? The cause of tinnitus is often not known. In some cases, it can result from other problems or conditions, including:  Exposure to loud noises from machinery, music, or other sources.  Hearing loss.  Ear or sinus infections.  Earwax buildup.  A foreign object in the ear.  Use of certain medicines.  Use of alcohol and caffeine.  High blood pressure.  Heart diseases.  Anemia.  Allergies.  Meniere disease.  Thyroid problems.  Tumors.  An enlarged part of a weakened blood vessel (aneurysm). What are the signs or symptoms? The main symptom of tinnitus  is hearing a sound when there is no source for that sound. It may sound like:  Buzzing.  Roaring.  Ringing.  Blowing air, similar to the sound heard when you listen to a seashell.  Hissing.  Whistling.  Sizzling.  Humming.  Running water.  A sustained musical note. How is this diagnosed? Tinnitus is diagnosed based on your symptoms. Your health care provider will do a physical exam. A comprehensive hearing exam (audiologic exam) will be done if your tinnitus:  Affects only one ear (unilateral).  Causes hearing difficulties.  Lasts 6 months or longer. You may also need to see a health care provider who specializes in hearing disorders (audiologist). You may be asked to complete a questionnaire to determine the severity of your tinnitus. Tests may be done to help determine the cause and to rule out other conditions. These can include:  Imaging studies of your head and brain, such as:  A CT scan.  An MRI.  An imaging study of your blood vessels (angiogram). How is this treated? Treating an underlying medical condition can sometimes make tinnitus go away. If your tinnitus continues, other treatments may include:  Medicines, such as certain antidepressants or sleeping aids.  Sound generators to mask the tinnitus. These include:  Tabletop sound machines that play relaxing sounds to help you fall asleep.  Wearable devices that fit in your ear and play sounds or music.  A small device that uses headphones to deliver a signal embedded  in music (acoustic neural stimulation). In time, this may change the pathways of your brain and make you less sensitive to tinnitus. This device is used for very severe cases when no other treatment is working.  Therapy and counseling to help you manage the stress of living with tinnitus.  Using hearing aids or cochlear implants, if your tinnitus is related to hearing loss. Follow these instructions at home:  When possible, avoid being in  loud places and being exposed to loud sounds.  Wear hearing protection, such as earplugs, when you are exposed to loud noises.  Do not take stimulants, such as nicotine, alcohol, or caffeine.  Practice techniques for reducing stress, such as meditation, yoga, or deep breathing.  Use a white noise machine, a humidifier, or other devices to mask the sound of tinnitus.  Sleep with your head slightly raised. This may reduce the impact of tinnitus.  Try to get plenty of rest each night. Contact a health care provider if:  You have tinnitus in just one ear.  Your tinnitus continues for 3 weeks or longer without stopping.  Home care measures are not helping.  You have tinnitus after a head injury.  You have tinnitus along with any of the following:  Dizziness.  Loss of balance.  Nausea and vomiting. This information is not intended to replace advice given to you by your health care provider. Make sure you discuss any questions you have with your health care provider. Document Released: 05/21/2005 Document Revised: 01/22/2016 Document Reviewed: 10/21/2013 Elsevier Interactive Patient Education  2017 [employer].   IF you received an x-ray today, you will receive an invoice from [facility]. Please contact [facility] at [phone] with questions or concerns regarding your invoice.   IF you received labwork today, you will receive an invoice from [place]. Please contact [facility] at [phone] with questions or concerns regarding your invoice.   Our billing staff will not be able to assist you with questions regarding bills from these companies.  You will be contacted with the lab results as soon as they are available. The fastest way to get your results is to activate your My Chart account. Instructions are located on the last page of this paperwork. If you have not heard from [place] regarding the results in 2 weeks, please contact this office.

## 2016-08-11 ENCOUNTER — Emergency Department (HOSPITAL_COMMUNITY)
Admission: EM | Admit: 2016-08-11 | Discharge: 2016-08-11 | Disposition: A | Discharge status: Home / Self Care | Payer: BLUE CROSS/BLUE SHIELD | Attending: Emergency Medicine | Admitting: Emergency Medicine

## 2016-08-11 ENCOUNTER — Emergency Department (HOSPITAL_COMMUNITY): Payer: BLUE CROSS/BLUE SHIELD

## 2016-08-11 ENCOUNTER — Encounter (HOSPITAL_COMMUNITY): Payer: Self-pay | Admitting: Emergency Medicine

## 2016-08-11 ENCOUNTER — Other Ambulatory Visit (HOSPITAL_COMMUNITY): Payer: Self-pay | Admitting: Emergency Medicine

## 2016-08-11 DIAGNOSIS — N50812 Left testicular pain: Secondary | ICD-10-CM | POA: Insufficient documentation

## 2016-08-11 DIAGNOSIS — Z79899 Other long term (current) drug therapy: Secondary | ICD-10-CM | POA: Insufficient documentation

## 2016-08-11 DIAGNOSIS — N50819 Testicular pain, unspecified: Secondary | ICD-10-CM

## 2016-08-11 LAB — URINALYSIS, ROUTINE W REFLEX MICROSCOPIC
BACTERIA UA: NONE SEEN
Bilirubin Urine: NEGATIVE
GLUCOSE, UA: NEGATIVE mg/dL
KETONES UR: 20 mg/dL — AB
LEUKOCYTES UA: NEGATIVE
NITRITE: NEGATIVE
PROTEIN: NEGATIVE mg/dL
Specific Gravity, Urine: 1.008 (ref 1.005–1.030)
pH: 6 (ref 5.0–8.0)

## 2016-08-11 MED ORDER — IBUPROFEN 400 MG PO TABS
400.0000 mg | ORAL_TABLET | Freq: Once | ORAL | Status: AC
  Administered 2016-08-11: 400 mg via ORAL
  Filled 2016-08-11: qty 1

## 2016-08-11 MED ORDER — LEVOFLOXACIN 500 MG PO TABS: 500.0000 mg | ORAL_TABLET | Freq: Every day | ORAL | 0 refills | Status: DC

## 2016-08-11 MED ORDER — IBUPROFEN 800 MG PO TABS: 800.0000 mg | ORAL_TABLET | Freq: Three times a day (TID) | ORAL | 0 refills | Status: DC

## 2016-08-11 NOTE — ED Triage Notes (Signed)
Pt states that he has been having swelling and discomfort in left testicle for 2 weeks.  Went to PCP and is suppose to follow up with urology.  Increased pain tonight.  Pt suppose to fly out of town today

## 2016-08-11 NOTE — Discharge Instructions (Signed)
The blood supply to your testicle is normal. There is no evidence of mass or abnormality. Follow-up with the urologist. Take antibiotics as prescribed. Return to the ED if you develop new or worsening symptoms.

## 2016-08-11 NOTE — ED Provider Notes (Signed)
[facility] DEPT Provider Note   CSN: [id number] Arrival date & time: 08/11/16  0300     History   Chief Complaint Chief Complaint  Patient presents with  . Testicle Pain    HPI Richard Fisher is a 46 y.o. male.  Patient reports she's had swelling and "discomfort" in his left testicle for the past 2 weeks. He saw his PCP and was referred to urology who he has not seen yet. He had increased pain and anxiety tonight. He was playing basketball earlier in the evening and thinks he may have aggravated something. Denies any direct trauma.. States the pain is much worse tonight after playing basketball. Denies any difficulty urinating or hematuria. Denies any abdominal pain, nausea, vomiting or fever. Patient has a history of melanoma in his arm that he states had clear margins. He is concerned that he could have testicular cancer. He is supposed to fly to [place] later today.   The history is provided by the patient.  Testicle Pain  Pertinent negatives include no chest pain, no abdominal pain, no headaches and no shortness of breath.    Past Medical History:  Diagnosis Date  . Melanoma ([name])     There are no active problems to display for this patient.   History reviewed. No pertinent surgical history.     Home Medications    Prior to Admission medications   Medication Sig Start Date End Date Taking? Authorizing Provider  sildenafil (VIAGRA) 100 MG tablet Take 100 mg by mouth daily as needed for erectile dysfunction.   Yes Historical Provider, MD    Family History History reviewed. No pertinent family history.  Social History Social History  Substance Use Topics  . Smoking status: Never Smoker  . Smokeless tobacco: Never Used  . Alcohol use No     Allergies   Amoxicillin   Review of Systems Review of Systems  Constitutional: Negative for activity change, appetite change and fever.  HENT: Negative for congestion and rhinorrhea.   Respiratory: Negative for  chest tightness and shortness of breath.   Cardiovascular: Negative for chest pain and leg swelling.  Gastrointestinal: Negative for abdominal pain, nausea and vomiting.  Genitourinary: Positive for scrotal swelling and testicular pain. Negative for dysuria, flank pain, frequency and hematuria.  Musculoskeletal: Negative for arthralgias, back pain and myalgias.  Skin: Negative for rash.  Neurological: Negative for dizziness, weakness and headaches.  A complete 10 system review of systems was obtained and all systems are negative except as noted in the HPI and PMH.     Physical Exam Updated Vital Signs BP 139/95 (BP Location: Right Arm)   Pulse 69   Temp 98 F (36.7 C) (Oral)   Resp 20   Ht 5' 9"  (1.753 m)   Wt 200 lb (90.7 kg)   SpO2 99%   BMI 29.53 kg/m   Physical Exam  Constitutional: He is oriented to person, place, and time. He appears well-developed and well-nourished. No distress.  HENT:  Head: Normocephalic and atraumatic.  Mouth/Throat: Oropharynx is clear and moist. No oropharyngeal exudate.  Eyes: Conjunctivae and EOM are normal. Pupils are equal, round, and reactive to light.  Neck: Normal range of motion. Neck supple.  No meningismus.  Cardiovascular: Normal rate, regular rhythm, normal heart sounds and intact distal pulses.   No murmur heard. Pulmonary/Chest: Effort normal and breath sounds normal. No respiratory distress.  Abdominal: Soft. There is no tenderness. There is no rebound and no guarding.  Genitourinary:  Genitourinary Comments: Left  testicle appears symmetric to right. There is tenderness superiorly over the epididymis. Normal normal lie. No significant edema.  Musculoskeletal: Normal range of motion. He exhibits no edema or tenderness.  Neurological: He is alert and oriented to person, place, and time. No cranial nerve deficit. He exhibits normal muscle tone. Coordination normal.  No ataxia on finger to nose bilaterally. No pronator drift. 5/5  strength throughout. CN 2-12 intact.Equal grip strength. Sensation intact.   Skin: Skin is warm.  Psychiatric: He has a normal mood and affect. His behavior is normal.  Nursing note and vitals reviewed.    ED Treatments / Results  Labs (all labs ordered are listed, but only abnormal results are displayed) Labs Reviewed  URINALYSIS, ROUTINE W REFLEX MICROSCOPIC - Abnormal; Notable for the following:       Result Value   Hgb urine dipstick SMALL (*)    Ketones, ur 20 (*)    All other components within normal limits    EKG  EKG Interpretation None       Radiology US Scrotum  Result Date: 08/11/2016 CLINICAL DATA:  Left testicular pain for 2 hours EXAM: SCROTAL ULTRASOUND DOPPLER ULTRASOUND OF THE TESTICLES TECHNIQUE: Complete ultrasound examination of the testicles, epididymis, and other scrotal structures was performed. Color and spectral Doppler ultrasound were also utilized to evaluate blood flow to the testicles. COMPARISON:  None. FINDINGS: Right testicle Measurements: 4.3 x 2.2 x 2.6 cm. No mass or microlithiasis visualized. Left testicle Measurements: 4.2 x 1.9 x 3.1 cm. No mass or microlithiasis visualized. Right epididymis:  Normal in size and appearance. Left epididymis:  Normal in size and appearance. Hydrocele:  None visualized. Varicocele:  None visualized. Pulsed Doppler interrogation of both testes demonstrates normal low resistance arterial and venous waveforms bilaterally. IMPRESSION: No testicular mass or torsion. No abnormal scrotal fluid collection. Color Doppler and Doppler spectral analysis confirm intact perfusion of both testes. Electronically Signed   By: Andreas Newport M.D.   On: 08/11/2016 05:17   [place] Art/ven Flow Abd Pelv Doppler  Result Date: 08/11/2016 CLINICAL DATA:  Left testicular pain for 2 hours EXAM: SCROTAL ULTRASOUND DOPPLER ULTRASOUND OF THE TESTICLES TECHNIQUE: Complete ultrasound examination of the testicles, epididymis, and other scrotal  structures was performed. Color and spectral Doppler ultrasound were also utilized to evaluate blood flow to the testicles. COMPARISON:  None. FINDINGS: Right testicle Measurements: 4.3 x 2.2 x 2.6 cm. No mass or microlithiasis visualized. Left testicle Measurements: 4.2 x 1.9 x 3.1 cm. No mass or microlithiasis visualized. Right epididymis:  Normal in size and appearance. Left epididymis:  Normal in size and appearance. Hydrocele:  None visualized. Varicocele:  None visualized. Pulsed Doppler interrogation of both testes demonstrates normal low resistance arterial and venous waveforms bilaterally. IMPRESSION: No testicular mass or torsion. No abnormal scrotal fluid collection. Color Doppler and Doppler spectral analysis confirm intact perfusion of both testes. Electronically Signed   By: Andreas Newport M.D.   On: 08/11/2016 05:17    Procedures Procedures (including critical care time)  Medications Ordered in ED Medications  ibuprofen (ADVIL,MOTRIN) tablet 400 mg (400 mg Oral Given 08/11/16 0336)     Initial Impression / Assessment and Plan / ED Course  I have reviewed the triage vital signs and the nursing notes.  Pertinent labs & imaging results that were available during my care of the patient were reviewed by me and considered in my medical decision making (see chart for details).    2 weeks of left testicular discomfort that became  acutely worse tonight after playing basketball. No fever or vomiting.  Urinalysis negative.  Ultrasound obtained to rule out torsion given acutely worsening pain. This is negative for torsion or other acute pathology.  Patient will be referred to urology for follow-up. Discussed pain control, scrotal elevation, prophylactic antibiotics. Does not appear to have epididymitis based on imaging or urinalysis. Patient concerned about possibly of testicular cancer. He was told that there is nothing suspicious on ultrasound but this also does not rule out  cancer.  Final Clinical Impressions(s) / ED Diagnoses   Final diagnoses:  Testicular pain    New Prescriptions New Prescriptions   No medications on file     Ezequiel Essex, MD 08/11/16 [phone]

## 2016-08-20 ENCOUNTER — Ambulatory Visit (INDEPENDENT_AMBULATORY_CARE_PROVIDER_SITE_OTHER): Payer: BLUE CROSS/BLUE SHIELD | Admitting: Neurology

## 2016-08-20 DIAGNOSIS — G4719 Other hypersomnia: Secondary | ICD-10-CM

## 2016-08-20 DIAGNOSIS — G4720 Circadian rhythm sleep disorder, unspecified type: Secondary | ICD-10-CM

## 2016-08-20 DIAGNOSIS — G4733 Obstructive sleep apnea (adult) (pediatric): Secondary | ICD-10-CM

## 2016-08-20 DIAGNOSIS — G4761 Periodic limb movement disorder: Secondary | ICD-10-CM

## 2016-08-21 ENCOUNTER — Ambulatory Visit (INDEPENDENT_AMBULATORY_CARE_PROVIDER_SITE_OTHER): Payer: BLUE CROSS/BLUE SHIELD | Admitting: Neurology

## 2016-08-21 DIAGNOSIS — G478 Other sleep disorders: Secondary | ICD-10-CM

## 2016-08-21 DIAGNOSIS — G4710 Hypersomnia, unspecified: Secondary | ICD-10-CM

## 2016-08-21 DIAGNOSIS — G4753 Recurrent isolated sleep paralysis: Secondary | ICD-10-CM

## 2016-08-21 DIAGNOSIS — R400 Somnolence: Secondary | ICD-10-CM

## 2016-08-27 DIAGNOSIS — H9313 Tinnitus, bilateral: Secondary | ICD-10-CM | POA: Diagnosis not present

## 2016-08-27 DIAGNOSIS — R590 Localized enlarged lymph nodes: Secondary | ICD-10-CM | POA: Diagnosis not present

## 2016-08-27 DIAGNOSIS — H903 Sensorineural hearing loss, bilateral: Secondary | ICD-10-CM | POA: Diagnosis not present

## 2016-08-27 NOTE — Progress Notes (Signed)
See result note for PSG. Richard Fisher

## 2016-08-27 NOTE — Progress Notes (Signed)
Patient referred by Dr. Carlota Raspberry, seen by me on 06/14/16, diagnostic PSG on 3/19 and MSLT on 08/21/16.   Please call and notify the patient that the recent sleep study did not show any significant obstructive sleep apnea, he had mild to mod snoring, he had mild PLMS and next day nap study showed mild sleepiness, not fulfilling criteria for an intrinsic sleepiness d/o such as narcolepsy or idiopathic hypersomnolence. He did not sleep well during the nighttime study, sleep efficiency only about 60%.  Please inform patient that I would like to go over the details of the study during a follow up appointment. Arrange a followup appointment. Also, route or fax report to PCP and referring MD, if other than PCP.  Once you have spoken to patient, you can close this encounter.   Thanks,  Star Age, MD, PhD [facility])

## 2016-08-27 NOTE — Procedures (Signed)
PATIENT'S NAME:  Richard Fisher, Richard Fisher DOB:      [birth year]      MR#:    [id number]     DATE OF RECORDING: 08/20/2016 REFERRING M.D.:  [name], MD Study Performed:   Baseline Polysomnogram HISTORY:. 46 year old man with an underlying medical history of ED, overweight state and skin cancer, who reports excessive daytime somnolence since his teenage years. The patient endorsed the Epworth Sleepiness Scale at 9/24 points. The patient's weight 207 pounds with a height of 71 (inches), resulting in a BMI of 29. kg/m2. The patient's neck circumference measured 16.2 inches.  CURRENT MEDICATIONS: Viagra.   PROCEDURE:  This is a multichannel digital polysomnogram utilizing the Somnostar 11.2 system.  Electrodes and sensors were applied and monitored per AASM Specifications.   EEG, EOG, Chin and Limb EMG, were sampled at 200 Hz.  ECG, Snore and Nasal Pressure, Thermal Airflow, Respiratory Effort, CPAP Flow and Pressure, Oximetry was sampled at 50 Hz. Digital video and audio were recorded.      BASELINE STUDY This study was follow the next day, 08/21/16, by a nap study. See separate report for details.   Lights Out was at 20:15 and Lights On at 05:37.  Total recording time (TRT) was 562.5 minutes, with a total sleep time (TST) of  352 minutes, which is borderline reduced and for next day MSLT a TST of 6 hours is required.   The patient's sleep latency was 30.5 minutes, which is prolonged.  REM latency was 86.5 minutes, which is normal.  The sleep efficiency was 62.6 %, which is reduced.     SLEEP ARCHITECTURE: WASO (Wake after sleep onset) was 186.5 minutes, which is markedly increased with one long period of wakefulness and otherwise mild sleep fragmentation noted.  There were 12 minutes in Stage N1, 203 minutes Stage N2, 55.5 minutes Stage N3 and 81.5 minutes in Stage REM.  The percentage of Stage N1 was 3.4%, Stage N2 was 57.7%, which is mildly increased, Stage N3 was 15.8%, which is normal and Stage R (REM  sleep) was 23.2%, which is normal.   The arousals were noted as: 13 were spontaneous, 4 were associated with PLMs, 1 were associated with respiratory events. Audio and video analysis did not show any abnormal or unusual movements, behaviors, phonations or vocalizations.   The patient took 3 bathroom breaks. Mild to moderate snoring was noted. The EKG was in keeping with normal sinus rhythm (NSR).  RESPIRATORY ANALYSIS:  There were a total of 9 respiratory events:  1 obstructive apneas, 0 central apneas and 0 mixed apneas with a total of 1 apneas and an apnea index (AI) of .2 /hour. There were 8 hypopneas with a hypopnea index of 1.4 /hour. The patient also had 0 respiratory event related arousals (RERAs).      The total APNEA/HYPOPNEA INDEX (AHI) was 1.5/hour and the total RESPIRATORY DISTURBANCE INDEX was 1.5 /hour.  4 events occurred in REM sleep and 10 events in NREM. The REM AHI was 2.9 /hour, versus a non-REM AHI of 1.1. The patient spent 104 minutes of total sleep time in the supine position and 248 minutes in non-supine.. The supine AHI was 0.6 versus a non-supine AHI of 1.9.  OXYGEN SATURATION & C02:  The Wake baseline 02 saturation was 97%, with the lowest being 83%. Time spent below 89% saturation equaled 1 minutes.  PERIODIC LIMB MOVEMENTS: The patient had a total of 111 Periodic Limb Movements.  The Periodic Limb Movement (PLM) index was 18.9 and  the PLM Arousal index was .7/hour. Post-study, the patient indicated that sleep was worse usual.   On 08/22/11, the patient had an MSLT: His mean sleep latency for 5 naps was 8.2 min with no sleep onset REM noted. This indicates borderline abnormal findings, but in the context of a decreased total sleep time the night before and reduced sleep efficiency, the findings are not supportive of narcolepsy or idiopathic hypersomnolence.   IMPRESSION: 1. Hypersomnia, unspecified 2. Periodic Limb Movement Disorder (PLMD) 3. Dysfunctions associated with  sleep stages or arousal from sleep  RECOMMENDATIONS: 1. This study does not demonstrate any significant obstructive or central sleep disordered breathing, but the patient does have mild to moderate snoring. 2. The nocturnal PSG and next day MSLT indicate mild daytime somnolence, but in the context of slightly less than 6 hours of total sleep time and sleep fragmentation, decreased sleep efficiency during the night, these studies do not clearly support the diagnosis of idiopathic hypersomnolence or narcolepsy.  3. Mild PLMs (periodic limb movements of sleep) were noted during the nighttime sleep study without significant arousals; clinical correlation is recommended. PLMs were noted primarily during REM sleep, which is an unusual finding and not typical for patients with underlying RLS (restless legs syndrome).  4. This study shows sleep fragmentation and mildly abnormal sleep stage percentages; these are nonspecific findings and per se do not signify an intrinsic sleep disorder or a cause for the patient's sleep-related symptoms. Causes include (but are not limited to) the first night effect of the sleep study, circadian rhythm disturbances, medication effect or an underlying mood disorder or medical problem.  5. The patient should be cautioned not to drive, work at heights, or operate dangerous or heavy equipment when tired or sleepy. Review and reiteration of good sleep hygiene measures should be pursued with any patient. 6. The patient will be seen in follow-up by Dr. Rexene Alberts at [facility] for discussion of the test results and further management strategies. The referring provider will be notified of the test results.  I certify that I have reviewed the entire raw data recording prior to the issuance of this report in accordance with the Standards of Accreditation of the American Academy of Sleep Medicine (AASM)  Star Age, MD, PhD Diplomat, American Board of Psychiatry and Neurology (Neurology and Sleep  Medicine)

## 2016-08-27 NOTE — Procedures (Signed)
Name:  Richard Fisher, Richard Fisher Reference [id number]  Study Date: 08/21/2016 Procedure #: [id number]  DOB: 1971-01-09    HISTORY:. 46 year old man with an underlying medical history of ED, overweight state and skin cancer, who reports excessive daytime somnolence since his teenage years. The patient endorsed the Epworth Sleepiness Scale at 9/24 points. The patient's weight 207 pounds with a height of 71 (inches), resulting in a BMI of 29. kg/m2. The patient's neck circumference measured 16.2 inches.  Protocol  This is a 13 channel Multiple Sleep Latency Test comprised of 5 channels of EEG (T3-Cz, Cz-T4, F4-M1, C4-M1, O2-M1), 3 channels of Chin EMG, 4 channels of EOG and 1 channel for ECG.   All channels were sampled at 256hz .    This polysomnographic procedure is designed to evaluate (1) the complaint of excessive daytime sleepiness by quantifying the time required to fall asleep and (2) the possibility of narcolepsy by checking for abnormally short latencies to REM sleep.  Electrographic variables include EEG, EMG, EOG and ECG.  Patients are monitored throughout four or five 20-minute opportunities to sleep (naps) at two-hour intervals.  For each nap, the patient is allowed 20 minutes to fall asleep.  Once asleep, the patient is awakened after 15 minutes.  Between naps, the patient is kept as alert as possible.  A sleep latency of 20 minutes indicates that no sleep occurred.  Parametric Analysis  Total Number of Naps 5     NAP # Time of Nap  Sleep Latency (mins) REM Latency (mins) Sleep Time Percent Awake Time Percent  1 07:16 20 0 0 100   2 09:14 6 0 70  30   3 11:15 3.5 0 76  24   4 13:18 5.5 0 56  44   5 15:11 6 0 65  35    MSLT Summary of Naps  Sleepiness Index: 59  Mean Sleep Latency to all Five Naps: 8.2  Mean Sleep Latency to First Four Naps: 8.8  Mean Sleep Latency to First Three Naps: 9.8  Mean Sleep Latency to First Two Naps: 13  Number of Naps with REM Sleep: 0    Results from  Preceding PSG Study  Sleep Onset Time 20:31 Sleep Efficiency (%) 62.6  Rise Time 05:37 Sleep Latency (min) 30.5  Total Sleep Time  352 REM Latency (min) 86.5    I attest to having reviewed every epoch of the entire raw data recording prior to the issuance of this report in accordance with the Standards of the American Academy of Sleep Medicine.    IMPRESSION:  Dx: Hypersomnia, Unspec.  1. This multiple sleep latency test reveals a mean sleep latency of 8.2 minutes with ?????0 sleep periods during which REM sleep was recorded.  A total of 5 sleep periods were recorded.   2. This study was preceded by an overnight polysomnogram with a total sleep time (TST) of 352 minutes.       Name:  Richard Fisher, Richard Fisher Reference #:  [id number]  Study Date: 08/21/2016 DOB: [birth year]    RECOMMENDATIONS:  1. The NPSG from 08/20/16 did not demonstrate any significant obstructive or central sleep disordered breathing, but the patient does have mild to moderate snoring. 2. The nocturnal PSG and next day MSLT indicate mild daytime somnolence, but in the context of slightly less than 6 hours of total sleep time and sleep fragmentation, decreased sleep efficiency during the night, these studies do not clearly support the diagnosis of idiopathic hypersomnolence or narcolepsy.  3. Mild PLMs (periodic limb  movements of sleep) were noted during the nighttime sleep study without significant arousals; clinical correlation is recommended. PLMs were noted primarily during REM sleep, which is an unusual finding and not typical for patients with underlying RLS (restless legs syndrome).  4. The patient should be cautioned not to drive, work at heights, or operate dangerous or heavy equipment when tired or sleepy. Review and reiteration of good sleep hygiene measures should be pursued with any patient. 5. The patient will be seen in follow-up by Dr. Rexene Alberts at [facility] for discussion of the test results and further management strategies. The  referring provider will be notified of the test results.  I certify that I have reviewed the entire raw data recording prior to the issuance of this report in accordance with the Standards of Accreditation of the American Academy of Sleep Medicine (AASM)  Star Age, MD, PhD Diplomat, American Board of Psychiatry and Neurology (Neurology and Sleep Medicine)

## 2016-08-30 ENCOUNTER — Telehealth: Payer: Self-pay

## 2016-08-30 NOTE — Telephone Encounter (Signed)
-----   Message from Star Age, MD sent at 08/27/2016  8:26 AM EDT ----- Patient referred by Dr. Carlota Raspberry, seen by me on 06/14/16, diagnostic PSG on 3/19 and MSLT on 08/21/16.   Please call and notify the patient that the recent sleep study did not show any significant obstructive sleep apnea, he had mild to mod snoring, he had mild PLMS and next day nap study showed mild sleepiness, not fulfilling criteria for an intrinsic sleepiness d/o such as narcolepsy or idiopathic hypersomnolence. He did not sleep well during the nighttime study, sleep efficiency only about 60%.  Please inform patient that I would like to go over the details of the study during a follow up appointment. Arrange a followup appointment. Also, route or fax report to PCP and referring MD, if other than PCP.  Once you have spoken to patient, you can close this encounter.   Thanks,  Star Age, MD, PhD [facility])

## 2016-08-30 NOTE — Telephone Encounter (Signed)
I spoke to patient and he is aware of results and recommendations. He was able to make a f/u appt with Dr. Rexene Alberts. Copy of results sent to PCP.

## 2016-09-14 ENCOUNTER — Encounter: Payer: Self-pay | Admitting: Family Medicine

## 2016-09-19 DIAGNOSIS — H33191 Other retinoschisis and retinal cysts, right eye: Secondary | ICD-10-CM | POA: Diagnosis not present

## 2016-09-20 DIAGNOSIS — N529 Male erectile dysfunction, unspecified: Secondary | ICD-10-CM | POA: Diagnosis not present

## 2016-09-20 DIAGNOSIS — N419 Inflammatory disease of prostate, unspecified: Secondary | ICD-10-CM | POA: Diagnosis not present

## 2016-09-30 ENCOUNTER — Encounter: Payer: Self-pay | Admitting: Family Medicine

## 2016-10-09 DIAGNOSIS — R682 Dry mouth, unspecified: Secondary | ICD-10-CM | POA: Diagnosis not present

## 2016-10-19 ENCOUNTER — Encounter: Payer: Self-pay | Admitting: Family Medicine

## 2016-11-05 ENCOUNTER — Encounter: Payer: Self-pay | Admitting: Family Medicine

## 2016-11-05 ENCOUNTER — Ambulatory Visit (INDEPENDENT_AMBULATORY_CARE_PROVIDER_SITE_OTHER): Payer: BLUE CROSS/BLUE SHIELD | Admitting: Family Medicine

## 2016-11-05 VITALS — BP 138/87 | HR 68 | Temp 98.5°F | Resp 16 | Ht 71.0 in | Wt 192.5 lb

## 2016-11-05 DIAGNOSIS — Z1329 Encounter for screening for other suspected endocrine disorder: Secondary | ICD-10-CM

## 2016-11-05 DIAGNOSIS — R208 Other disturbances of skin sensation: Secondary | ICD-10-CM | POA: Diagnosis not present

## 2016-11-05 DIAGNOSIS — R634 Abnormal weight loss: Secondary | ICD-10-CM

## 2016-11-05 DIAGNOSIS — H538 Other visual disturbances: Secondary | ICD-10-CM | POA: Diagnosis not present

## 2016-11-05 LAB — GLUCOSE, POCT (MANUAL RESULT ENTRY): POC Glucose: 102 mg/dl — AB (ref 70–99)

## 2016-11-05 NOTE — Progress Notes (Signed)
Richard Fisher is a 46 y.o. male who presents to Primary Care at [facility] today for blurry vision  1. Blurry Vision: Patient states that for the last 3 months he has had some blurry vision. He notes that on March 9th he was flying to [place]. When he landed, he noticed that he was having a hard time reading the signs. He couldn't tell if it was both eyes or just one. Did not have any pain in his eyes, redness, tearing, or complete vision loss. No trouble with moving his eyes. Thinks that the blurriness is worse indoors, it is ok outdoors. He notes that he has no trouble watching TV but sometimes has difficulty with seeing faces. He does have glasses that were given to him to improve his vision although the last time he was at the opthamologist in late March he had 20/20. Denies any new medications. Is not taking viagra. Denies any trauma.   Of note, patient has been under lots of anxiety recently. He has been seeing a psychiatrist about this. He notes that his appetite has been down and he is not eating much. His weight has dropped. Notes that he also transitioned to a full vegan diet after getting the diagnosis of melanoma. Admits to some paresthesias.   ROS as above.  Pertinently, no chest pain, palpitations, SOB, Fever, Chills, Abd pain, N/V/D.   PMH reviewed. Patient is a nonsmoker.   Past Medical History:  Diagnosis Date  . Melanoma ([place])    History reviewed. No pertinent surgical history.  Medications reviewed. Current Outpatient Prescriptions  Medication Sig Dispense Refill  . sildenafil (VIAGRA) 100 MG tablet Take 100 mg by mouth daily as needed for erectile dysfunction.    Marland Kitchen ibuprofen (ADVIL,MOTRIN) 800 MG tablet Take 1 tablet (800 mg total) by mouth 3 (three) times daily. (Patient not taking: Reported on 11/05/2016) 21 tablet 0  . levofloxacin (LEVAQUIN) 500 MG tablet Take 1 tablet (500 mg total) by mouth daily. (Patient not taking: Reported on 11/05/2016) 10 tablet 0   No current  facility-administered medications for this visit.      Physical Exam:  BP 138/87 (BP Location: Right Arm, Patient Position: Sitting, Cuff Size: Small)   Pulse 68   Temp 98.5 F (36.9 C) (Oral)   Resp 16   Ht 5' 11"  (1.803 m)   Wt 192 lb 8 oz (87.3 kg)   SpO2 98%   BMI 26.85 kg/m  Gen:  Alert, cooperative patient who appears stated age in no acute distress.  Vital signs reviewed. HEENT:     Head: Normocephalic, atraumatic    Neck: No masses palpated. No goiter. No lymphadenopathy     Ears: External ears normal, no drainage.Tympanic membranes intact, normal light reflex bilaterally, no erythema or bulging    Eyes: PERRLA, EOMI, sclera white, normal conjunctiva, normal fundoscopic exam    Nose: nasal turbinates moist, no nasal discharge    Throat: moist mucus membranes, no pharyngeal erythema, no tonsillar exudate. Airway is patent Pulm:  Clear to auscultation bilaterally with good air movement.  No wheezes or rales noted.   Cardiac:  Regular rate and rhythm without murmur auscultated.  Good S1/S2. Exts: Non edematous BL  LE, warm and well perfused.   Vision test:  Without glasses: 20/20 in both eyes, 20/40 in left eye, 20/20 in right eye With glasses: 20/13 in both eyes, 20/15 in left eye, 20/13 in right eye  Results for orders placed or performed in visit on 11/05/16  POCT glucose (manual entry)  Result Value Ref Range   POC Glucose 102 (A) 70 - 99 mg/dl    Assessment and Plan:  1.  Blurry Vision: Has been ongoing for at least a couple months. No signs of keratitis, acute glaucoma, and uveitis. Vision decrease in left eye (20/40) without glasses, but was normal with corrective lenses that he had previously obtained with his eye doctor. Suspect this is normal change with aging and possible anxiety component (patient has endorsed anxiety and weight loss which he is following psychiatry for). The plan is as follows:  - Patient will make an appt with his eye doctor - Obtain CBC,  CMP today (patient has been endorsing some fatigue) - Follow up at our office after optho visit  Smitty Cords, MD [facility], PGY-2

## 2016-11-05 NOTE — Patient Instructions (Addendum)
  Thank you for coming in today, it was so nice to see you! Today we talked about:    Blurry vision: This could be due to age related changes but I think it is important to follow up with your opthamologist for a more thorough eye exam. Please make an appt with them as soon as possible  We have checked your blood work today too to make sure that is all normal  Please follow up after you see the opthomolagist.  If we ordered any tests today, you will be notified of any abnormalities. If everything is normal you will get a letter in the mail.   Sincerely,  Smitty Cords, MD    IF you received an x-ray today, you will receive an invoice from [facility]. Please contact [facility] at [phone] with questions or concerns regarding your invoice.   IF you received labwork today, you will receive an invoice from [facility]. Please contact [facility] at [phone] with questions or concerns regarding your invoice.   Our billing staff will not be able to assist you with questions regarding bills from these companies.  You will be contacted with the lab results as soon as they are available. The fastest way to get your results is to activate your My Chart account. Instructions are located on the last page of this paperwork. If you have not heard from [place] regarding the results in 2 weeks, please contact this office.

## 2016-11-06 DIAGNOSIS — N419 Inflammatory disease of prostate, unspecified: Secondary | ICD-10-CM | POA: Diagnosis not present

## 2016-11-06 LAB — CBC
HEMATOCRIT: 45.2 % (ref 37.5–51.0)
Hemoglobin: 15.3 g/dL (ref 13.0–17.7)
MCH: 30.5 pg (ref 26.6–33.0)
MCHC: 33.8 g/dL (ref 31.5–35.7)
MCV: 90 fL (ref 79–97)
Platelets: 271 10*3/uL (ref 150–379)
RBC: 5.02 x10E6/uL (ref 4.14–5.80)
RDW: 13.3 % (ref 12.3–15.4)
WBC: 7.7 10*3/uL (ref 3.4–10.8)

## 2016-11-06 LAB — COMPREHENSIVE METABOLIC PANEL
A/G RATIO: 2 (ref 1.2–2.2)
ALBUMIN: 5 g/dL (ref 3.5–5.5)
ALK PHOS: 50 IU/L (ref 39–117)
ALT: 14 IU/L (ref 0–44)
AST: 14 IU/L (ref 0–40)
BILIRUBIN TOTAL: 0.8 mg/dL (ref 0.0–1.2)
BUN / CREAT RATIO: 13 (ref 9–20)
BUN: 11 mg/dL (ref 6–24)
CHLORIDE: 105 mmol/L (ref 96–106)
CO2: 25 mmol/L (ref 18–29)
CREATININE: 0.88 mg/dL (ref 0.76–1.27)
Calcium: 10.1 mg/dL (ref 8.7–10.2)
GFR calc Af Amer: 119 mL/min/{1.73_m2} (ref >59)
GFR calc non Af Amer: 103 mL/min/{1.73_m2} (ref >59)
GLOBULIN, TOTAL: 2.5 g/dL (ref 1.5–4.5)
Glucose: 99 mg/dL (ref 65–99)
Potassium: 4.7 mmol/L (ref 3.5–5.2)
SODIUM: 143 mmol/L (ref 134–144)
Total Protein: 7.5 g/dL (ref 6.0–8.5)

## 2016-11-06 LAB — TSH: TSH: 2.07 u[IU]/mL (ref 0.450–4.500)

## 2016-11-07 DIAGNOSIS — H33191 Other retinoschisis and retinal cysts, right eye: Secondary | ICD-10-CM | POA: Diagnosis not present

## 2016-11-08 ENCOUNTER — Encounter: Payer: Self-pay | Admitting: Family Medicine

## 2016-11-08 NOTE — Progress Notes (Signed)
Patient discussed/examined with Dr. Juanito Doom. Agree with findings, assessment and plan of care per her note.  Initial eval with ophthalmology. Most recent visit with them did not indicate any concerns. Will check other lab work for fatigue and reported dysesthesias, weight loss,, but possible anxiety component. Recheck after optho visit, RTC precautions if worsening sooner.

## 2016-11-09 ENCOUNTER — Emergency Department (HOSPITAL_COMMUNITY): Payer: BLUE CROSS/BLUE SHIELD

## 2016-11-09 ENCOUNTER — Encounter (HOSPITAL_COMMUNITY): Payer: Self-pay | Admitting: *Deleted

## 2016-11-09 ENCOUNTER — Emergency Department (HOSPITAL_COMMUNITY)
Admission: EM | Admit: 2016-11-09 | Discharge: 2016-11-09 | Disposition: A | Discharge status: Home / Self Care | Payer: BLUE CROSS/BLUE SHIELD | Attending: Emergency Medicine | Admitting: Emergency Medicine

## 2016-11-09 DIAGNOSIS — Z85820 Personal history of malignant melanoma of skin: Secondary | ICD-10-CM | POA: Diagnosis not present

## 2016-11-09 DIAGNOSIS — Z79899 Other long term (current) drug therapy: Secondary | ICD-10-CM | POA: Insufficient documentation

## 2016-11-09 DIAGNOSIS — R05 Cough: Secondary | ICD-10-CM | POA: Diagnosis not present

## 2016-11-09 DIAGNOSIS — R079 Chest pain, unspecified: Secondary | ICD-10-CM | POA: Diagnosis not present

## 2016-11-09 DIAGNOSIS — R5383 Other fatigue: Secondary | ICD-10-CM | POA: Diagnosis not present

## 2016-11-09 DIAGNOSIS — Z0389 Encounter for observation for other suspected diseases and conditions ruled out: Secondary | ICD-10-CM | POA: Diagnosis not present

## 2016-11-09 DIAGNOSIS — R52 Pain, unspecified: Secondary | ICD-10-CM | POA: Diagnosis not present

## 2016-11-09 DIAGNOSIS — R55 Syncope and collapse: Secondary | ICD-10-CM | POA: Diagnosis not present

## 2016-11-09 HISTORY — DX: Anxiety disorder, unspecified: F41.9

## 2016-11-09 HISTORY — DX: Other fatigue: R53.83

## 2016-11-09 LAB — COMPREHENSIVE METABOLIC PANEL
ALBUMIN: 4.4 g/dL (ref 3.5–5.0)
ALT: 17 U/L (ref 17–63)
ANION GAP: 10 (ref 5–15)
AST: 18 U/L (ref 15–41)
Alkaline Phosphatase: 42 U/L (ref 38–126)
BUN: 10 mg/dL (ref 6–20)
CO2: 27 mmol/L (ref 22–32)
Calcium: 9.5 mg/dL (ref 8.9–10.3)
Chloride: 102 mmol/L (ref 101–111)
Creatinine, Ser: 0.89 mg/dL (ref 0.61–1.24)
GFR calc Af Amer: >60 mL/min (ref >60)
GFR calc non Af Amer: >60 mL/min (ref >60)
GLUCOSE: 91 mg/dL (ref 65–99)
POTASSIUM: 3.6 mmol/L (ref 3.5–5.1)
SODIUM: 139 mmol/L (ref 135–145)
Total Bilirubin: 1.4 mg/dL — ABNORMAL HIGH (ref 0.3–1.2)
Total Protein: 7.3 g/dL (ref 6.5–8.1)

## 2016-11-09 LAB — CBC WITH DIFFERENTIAL/PLATELET
BASOS PCT: 1 %
Basophils Absolute: 0.1 10*3/uL (ref 0.0–0.1)
Eosinophils Absolute: 0.3 10*3/uL (ref 0.0–0.7)
Eosinophils Relative: 4 %
HEMATOCRIT: 45 % (ref 39.0–52.0)
HEMOGLOBIN: 15.5 g/dL (ref 13.0–17.0)
LYMPHS ABS: 2.7 10*3/uL (ref 0.7–4.0)
Lymphocytes Relative: 32 %
MCH: 30.8 pg (ref 26.0–34.0)
MCHC: 34.4 g/dL (ref 30.0–36.0)
MCV: 89.3 fL (ref 78.0–100.0)
MONOS PCT: 6 %
Monocytes Absolute: 0.5 10*3/uL (ref 0.1–1.0)
NEUTROS ABS: 4.8 10*3/uL (ref 1.7–7.7)
NEUTROS PCT: 57 %
Platelets: 255 10*3/uL (ref 150–400)
RBC: 5.04 MIL/uL (ref 4.22–5.81)
RDW: 12.8 % (ref 11.5–15.5)
WBC: 8.3 10*3/uL (ref 4.0–10.5)

## 2016-11-09 LAB — URINALYSIS, ROUTINE W REFLEX MICROSCOPIC
BILIRUBIN URINE: NEGATIVE
Glucose, UA: NEGATIVE mg/dL
Hgb urine dipstick: NEGATIVE
Ketones, ur: 20 mg/dL — AB
Leukocytes, UA: NEGATIVE
NITRITE: NEGATIVE
PROTEIN: NEGATIVE mg/dL
Specific Gravity, Urine: 1.012 (ref 1.005–1.030)
pH: 6 (ref 5.0–8.0)

## 2016-11-09 LAB — TROPONIN I: Troponin I: <0.03 ng/mL (ref <0.03)

## 2016-11-09 NOTE — Discharge Instructions (Signed)
Continue to take your usual prescriptions as previously directed. Your CT scan showed an incidental finding:  "No CT evidence for acute intracranial abnormality. Dolichoectasia of the left vertebral artery; further evaluation with MRI could be obtained." The Neurologist or your regular medical doctor can follow up this finding.  Call your regular medical doctor and the Neurologist on Monday to schedule a follow up appointment this week.  Return to the Emergency Department immediately sooner if worsening.

## 2016-11-09 NOTE — ED Provider Notes (Signed)
[facility] DEPT Provider Note   CSN: [id number] Arrival date & time: 11/09/16  1747     History   Chief Complaint Chief Complaint  Patient presents with  . Fatigue    6 months    HPI Richard Fisher is a 46 y.o. male.  HPI  Pt was seen at 1926. Per pt, c/o gradual onset and persistence of constant "fatigue" for the past 6 months, worse over the past 2 months. Pt states he has been evaluated by multiple physicians for this complaint, without definitive diagnosis. Pt has been evaluated by his PMD multiple times this year, with last PMD visit 4 days ago "where they told me my blood work was fine." States he also recently was evaluated by his Ophtho MD for blurred vision for the past 2 to 3 months and was told "I was just getting old" and "needed to wear my glasses" (blurred vision corrects with his glasses on); and by a Sleep MD for possible sleep disorder with negative testing.  Denies CP/palpitations, no SOB/cough, no abd pain, no N/V/D, no focal motor weakness, no tingling/numbness in extremities.   Past Medical History:  Diagnosis Date  . Anxiety   . Fatigue   . Melanoma ([place])     There are no active problems to display for this patient.   History reviewed. No pertinent surgical history.     Home Medications    Prior to Admission medications   Medication Sig Start Date End Date Taking? Authorizing Provider  sildenafil (VIAGRA) 100 MG tablet Take 100 mg by mouth daily as needed for erectile dysfunction.    [provider]    Family History History reviewed. No pertinent family history.  Social History Social History  Substance Use Topics  . Smoking status: Never Smoker  . Smokeless tobacco: Never Used  . Alcohol use No     Allergies   Amoxicillin and Levaquin [levofloxacin in d5w]   Review of Systems Review of Systems ROS: Statement: All systems negative except as marked or noted in the HPI; Constitutional: Negative for fever and chills.  +generalized fatigue.; ; Eyes: Negative for eye pain, redness and discharge. ; ; ENMT: Negative for ear pain, hoarseness, nasal congestion, sinus pressure and sore throat. ; ; Cardiovascular: Negative for chest pain, palpitations, diaphoresis, dyspnea and peripheral edema. ; ; Respiratory: Negative for cough, wheezing and stridor. ; ; Gastrointestinal: Negative for nausea, vomiting, diarrhea, abdominal pain, blood in stool, hematemesis, jaundice and rectal bleeding. . ; ; Genitourinary: Negative for dysuria, flank pain and hematuria. ; ; Musculoskeletal: Negative for back pain and neck pain. Negative for swelling and trauma.; ; Skin: Negative for pruritus, rash, abrasions, blisters, bruising and skin lesion.; ; Neuro: Negative for headache, lightheadedness and neck stiffness. Negative for weakness, altered level of consciousness, altered mental status, extremity weakness, paresthesias, involuntary movement, seizure and syncope.       Physical Exam Updated Vital Signs BP (!) 136/95   Pulse 66   Temp 98.5 F (36.9 C) (Oral)   Resp 18   Ht 5' 11"  (1.803 m)   Wt 86.2 kg (190 lb)   SpO2 98%   BMI 26.50 kg/m    19:46 Orthostatic Vital Signs TW  Orthostatic Lying   BP- Lying: 131/82  Pulse- Lying: 57      Orthostatic Sitting  BP- Sitting: 122/76  Pulse- Sitting: 60      Orthostatic Standing at 0 minutes  BP- Standing at 0 minutes: 129/89  Pulse- Standing at 0 minutes:  75     Physical Exam 1930: Physical examination:  Nursing notes reviewed; Vital signs and O2 SAT reviewed;  Constitutional: Well developed, Well nourished, Well hydrated, In no acute distress; Head:  Normocephalic, atraumatic; Eyes: EOMI, PERRL, No scleral icterus; ENMT: Mouth and pharynx normal, Mucous membranes moist; Neck: Supple, Full range of motion, No lymphadenopathy; Cardiovascular: Regular rate and rhythm, No gallop; Respiratory: Breath sounds clear & equal bilaterally, No wheezes.  Speaking full sentences with  ease, Normal respiratory effort/excursion; Chest: Nontender, Movement normal; Abdomen: Soft, Nontender, Nondistended, Normal bowel sounds; Genitourinary: No CVA tenderness; Extremities: Pulses normal, No tenderness, No edema, No calf edema or asymmetry.; Neuro: AA&Ox3, Major CN grossly intact. No facial droop. Speech clear. No gross focal motor or sensory deficits in extremities. Climbs on and off stretcher easily by himself. Gait steady.; Skin: Color normal, Warm, Dry.; Psych:  Anxious.    ED Treatments / Results  Labs (all labs ordered are listed, but only abnormal results are displayed)   EKG  EKG Interpretation  Date/Time:  Friday November 09 2016 20:48:58 EDT Ventricular Rate:  63 PR Interval:    QRS Duration: 92 QT Interval:  415 QTC Calculation: 425 R Axis:   70 Text Interpretation:  Sinus rhythm Probable anteroseptal infarct, old No old tracing to compare Confirmed by [facility]  MD, Nunzio Cory [phone]) on 11/09/2016 8:52:57 PM       Radiology   Procedures Procedures (including critical care time)  Medications Ordered in ED Medications - No data to display   Initial Impression / Assessment and Plan / ED Course  I have reviewed the triage vital signs and the nursing notes.  Pertinent labs & imaging results that were available during my care of the patient were reviewed by me and considered in my medical decision making (see chart for details).  MDM Reviewed: previous chart, nursing note and vitals Reviewed previous: labs Interpretation: labs, ECG, x-ray and CT scan    Results for orders placed or performed during the hospital encounter of 11/09/16  Urinalysis, Routine w reflex microscopic  Result Value Ref Range   Color, Urine YELLOW YELLOW   APPearance CLEAR CLEAR   Specific Gravity, Urine 1.012 1.005 - 1.030   pH 6.0 5.0 - 8.0   Glucose, UA NEGATIVE NEGATIVE mg/dL   Hgb urine dipstick NEGATIVE NEGATIVE   Bilirubin Urine NEGATIVE NEGATIVE   Ketones, ur 20 (A)  NEGATIVE mg/dL   Protein, ur NEGATIVE NEGATIVE mg/dL   Nitrite NEGATIVE NEGATIVE   Leukocytes, UA NEGATIVE NEGATIVE  Comprehensive metabolic panel  Result Value Ref Range   Sodium 139 135 - 145 mmol/L   Potassium 3.6 3.5 - 5.1 mmol/L   Chloride 102 101 - 111 mmol/L   CO2 27 22 - 32 mmol/L   Glucose, Bld 91 65 - 99 mg/dL   BUN 10 6 - 20 mg/dL   Creatinine, Ser 0.89 0.61 - 1.24 mg/dL   Calcium 9.5 8.9 - 10.3 mg/dL   Total Protein 7.3 6.5 - 8.1 g/dL   Albumin 4.4 3.5 - 5.0 g/dL   AST 18 15 - 41 U/L   ALT 17 17 - 63 U/L   Alkaline Phosphatase 42 38 - 126 U/L   Total Bilirubin 1.4 (H) 0.3 - 1.2 mg/dL   GFR calc non Af Amer >60 >60 mL/min   GFR calc Af Amer >60 >60 mL/min   Anion gap 10 5 - 15  Troponin I  Result Value Ref Range   Troponin I <0.03 <0.03 ng/mL  CBC with Differential  Result Value Ref Range   WBC 8.3 4.0 - 10.5 K/uL   RBC 5.04 4.22 - 5.81 MIL/uL   Hemoglobin 15.5 13.0 - 17.0 g/dL   HCT 45.0 39.0 - 52.0 %   MCV 89.3 78.0 - 100.0 fL   MCH 30.8 26.0 - 34.0 pg   MCHC 34.4 30.0 - 36.0 g/dL   RDW 12.8 11.5 - 15.5 %   Platelets 255 150 - 400 K/uL   Neutrophils Relative % 57 %   Neutro Abs 4.8 1.7 - 7.7 K/uL   Lymphocytes Relative 32 %   Lymphs Abs 2.7 0.7 - 4.0 K/uL   Monocytes Relative 6 %   Monocytes Absolute 0.5 0.1 - 1.0 K/uL   Eosinophils Relative 4 %   Eosinophils Absolute 0.3 0.0 - 0.7 K/uL   Basophils Relative 1 %   Basophils Absolute 0.1 0.0 - 0.1 K/uL    Dg Chest 2 View Result Date: 11/09/2016 CLINICAL DATA:  Fatigue x 2 months, near syncope today when walking around. Denies chest pain, sob, or cough. History of Melanoma. EXAM: CHEST  2 VIEW COMPARISON:  05/04/2016 FINDINGS: Mild hyperinflation. Midline trachea. Normal heart size and mediastinal contours.Sharp costophrenic angles. No pneumothorax. Clear lungs. IMPRESSION: No active cardiopulmonary disease. Electronically Signed   By: Abigail Miyamoto M.D.   On: 11/09/2016 20:29   Ct Head Wo Contrast Result  Date: 11/09/2016 CLINICAL DATA:  Lightheadedness, fatigue EXAM: CT HEAD WITHOUT CONTRAST TECHNIQUE: Contiguous axial images were obtained from the base of the skull through the vertex without intravenous contrast. COMPARISON:  None. FINDINGS: Brain: No acute territorial infarction, hemorrhage, or intracranial mass is seen. The ventricles are nonenlarged. Vascular: No unexpected calcifications. 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 Skull: No fracture or suspicious bone lesion Sinuses/Orbits: No acute finding. Other: None IMPRESSION: No CT evidence for acute intracranial abnormality. Dolichoectasia of the left vertebral artery ; if symptomatology is suggestive of cranial nerve abnormality, further evaluation with MRI could be obtained. Electronically Signed   By: Donavan Foil M.D.   On: 11/09/2016 20:28    2115:  T/C to Neuro Dr. Merlene Laughter, case discussed, including:  HPI, pertinent PM/SHx, VS/PE, dx testing, ED course and treatment: CT findings often found as incidental finding, likely not acute if symptoms (blurry vision) have been present for 2-3 months, pt can f/u as outpatient for MRI.   2150:  Not orthostatic on VS. Workup otherwise reassuring. Dx and testing d/w pt.  Questions answered.  Verb understanding, agreeable to d/c home with outpt f/u.     Final Clinical Impressions(s) / ED Diagnoses   Final diagnoses:  None    New Prescriptions New Prescriptions   No medications on file     Francine Graven, DO 11/12/16 2138

## 2016-11-09 NOTE — ED Triage Notes (Signed)
States he has had episodes of lghtheadedness after being mobile chronic for the past 2 months,has seen several physicians recently to be checked out for various problems.

## 2016-11-10 ENCOUNTER — Emergency Department (HOSPITAL_COMMUNITY)
Admission: EM | Admit: 2016-11-10 | Discharge: 2016-11-10 | Disposition: A | Discharge status: Home / Self Care | Payer: BLUE CROSS/BLUE SHIELD | Attending: Emergency Medicine | Admitting: Emergency Medicine

## 2016-11-10 ENCOUNTER — Encounter (HOSPITAL_COMMUNITY): Payer: Self-pay

## 2016-11-10 DIAGNOSIS — R5383 Other fatigue: Secondary | ICD-10-CM | POA: Diagnosis not present

## 2016-11-10 DIAGNOSIS — F419 Anxiety disorder, unspecified: Secondary | ICD-10-CM

## 2016-11-10 NOTE — Discharge Instructions (Signed)
Please see your GP for referral to a psychiatrist.

## 2016-11-10 NOTE — ED Triage Notes (Signed)
Pt reports was here a few months ago for testicular pain.  Pt says now testicles feel hard and is concerned about testicular cancer.  Reports was here yesterday for extreme fatigue and reports decreased appetite.

## 2016-11-10 NOTE — ED Provider Notes (Signed)
[facility] DEPT Provider Note   CSN: [id number] Arrival date & time: 11/10/16  0650     History   Chief Complaint Chief Complaint  Patient presents with  . Testicle Pain    HPI Richard Fisher is a 46 y.o. male.  HPI  The patient is a 46 year old male who has a history of fatigue over the last 6 months or so, recently diagnosed with melanoma of his arm and since that time his had significant anxiety. 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 He was in the emergency department last night and had a very complete workup including a CT scan of the brain for his chronic fatigue, some blurry vision for which she is artery seen an ophthalmologist and his generalized symptoms. He feels as though these things have worsened over time though his metabolic workup including a hematologic workup and a recent TSH is doctor's office to been completely unremarkable.  Past Medical History:  Diagnosis Date  . Anxiety   . Fatigue   . Melanoma ([place])     There are no active problems to display for this patient.   History reviewed. No pertinent surgical history.     Home Medications    Prior to Admission medications   Medication Sig Start Date End Date Taking? Authorizing Provider  Cholecalciferol (VITAMIN D) 2000 units CAPS Take 1 capsule by mouth 2 (two) times a week.    [provider]  magnesium 30 MG tablet Take 30 mg by mouth 2 (two) times a week.    [provider]  sildenafil (VIAGRA) 100 MG tablet Take 100 mg by mouth daily as needed for erectile dysfunction.    [provider]  VITAMIN K PO Take 1 tablet by mouth daily.     [provider]    Family History No family history on file.  Social History Social History  Substance Use Topics  . Smoking status: Never Smoker  . Smokeless tobacco: Never Used  . Alcohol use No     Allergies   Amoxicillin and Levaquin [levofloxacin in d5w]   Review of Systems Review of Systems  All other systems reviewed and are negative.    Physical Exam Updated Vital Signs BP (!) 133/96 (BP Location: Left Arm)   Pulse 78   Temp 98.2 F (36.8 C) (Oral)   Resp 18   Ht 5' 11"  (1.803 m)   Wt 85.3 kg (188 lb)   SpO2 100%   BMI 26.22 kg/m   Physical Exam  Constitutional: He appears well-developed and well-nourished. No distress.  HENT:  Head: Normocephalic and atraumatic.  Mouth/Throat: Oropharynx is clear and moist. No oropharyngeal exudate.  Eyes: Conjunctivae and EOM are normal. Pupils are equal, round, and reactive to light. Right eye exhibits no discharge. Left eye exhibits no discharge. No scleral icterus.  Neck: Normal range of motion. Neck supple. No JVD present. No thyromegaly present.  Cardiovascular: Normal rate, regular rhythm, normal heart sounds and intact distal pulses.  Exam reveals no gallop and no friction rub.   No murmur heard. Pulmonary/Chest: Effort normal and breath sounds normal. No respiratory distress. He has no wheezes.  He has no rales.  Abdominal: Soft. Bowel sounds are normal. He exhibits no distension and no mass. There is no tenderness.  Genitourinary:  Genitourinary Comments: Normal scrotum and testicles - no masses, no redness, no ttp.  Normal penis  Musculoskeletal: Normal range of motion. He exhibits no edema or tenderness.  Lymphadenopathy:    He has no cervical adenopathy.  Neurological: He is alert. Coordination normal.  Skin: Skin is warm and dry. No rash noted. No erythema.  Psychiatric: He has a normal mood and affect. His behavior is normal.  Nursing note and vitals reviewed.    ED Treatments / Results    Labs (all labs ordered are listed, but only abnormal results are displayed) Labs Reviewed - No data to display   Radiology Dg Chest 2 View  Result Date: 11/09/2016 CLINICAL DATA:  Fatigue x 2 months, near syncope today when walking around. Denies chest pain, sob, or cough. History of Melanoma. EXAM: CHEST  2 VIEW COMPARISON:  05/04/2016 FINDINGS: Mild hyperinflation. Midline trachea. Normal heart size and mediastinal contours.Sharp costophrenic angles. No pneumothorax. Clear lungs. IMPRESSION: No active cardiopulmonary disease. Electronically Signed   By: Abigail Miyamoto M.D.   On: 11/09/2016 20:29   Ct Head Wo Contrast  Result Date: 11/09/2016 CLINICAL DATA:  Lightheadedness, fatigue EXAM: CT HEAD WITHOUT CONTRAST TECHNIQUE: Contiguous axial images were obtained from the base of the skull through the vertex without intravenous contrast. COMPARISON:  None. FINDINGS: Brain: No acute territorial infarction, hemorrhage, or intracranial mass is seen. The ventricles are nonenlarged. Vascular: No unexpected calcifications. 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 Skull: No fracture or suspicious bone lesion Sinuses/Orbits: No acute finding. Other: None IMPRESSION: No CT evidence for acute intracranial abnormality. Dolichoectasia of the left vertebral artery ; if symptomatology is suggestive of cranial nerve abnormality, further evaluation with MRI could be obtained. Electronically Signed   By: Donavan Foil M.D.   On: 11/09/2016 20:28    Procedures Procedures (including critical care time)  Medications Ordered in ED Medications - No data to display   Initial Impression / Assessment and Plan / ED Course  I have reviewed the triage vital signs and the nursing notes.  Pertinent labs & imaging results that were available during my care of the patient were reviewed by me and considered in my medical decision making (see chart for details).     The patient has an unremarkable appearance, he is  totally stable for discharge, he is artery had a very complete workup and I suspect most of this is due to anxiety. He will follow up with his general practitioner  Final Clinical Impressions(s) / ED Diagnoses   Final diagnoses:  Anxiety    New Prescriptions New Prescriptions   No medications on file     Noemi Chapel, MD 11/10/16 [phone]

## 2016-11-12 ENCOUNTER — Ambulatory Visit (INDEPENDENT_AMBULATORY_CARE_PROVIDER_SITE_OTHER): Payer: BLUE CROSS/BLUE SHIELD | Admitting: Family Medicine

## 2016-11-12 ENCOUNTER — Encounter: Payer: Self-pay | Admitting: Family Medicine

## 2016-11-12 VITALS — BP 133/80 | HR 70 | Temp 98.2°F | Resp 16 | Ht 71.0 in | Wt 192.0 lb

## 2016-11-12 DIAGNOSIS — F329 Major depressive disorder, single episode, unspecified: Secondary | ICD-10-CM | POA: Diagnosis not present

## 2016-11-12 DIAGNOSIS — N50819 Testicular pain, unspecified: Secondary | ICD-10-CM | POA: Diagnosis not present

## 2016-11-12 DIAGNOSIS — R9431 Abnormal electrocardiogram [ECG] [EKG]: Secondary | ICD-10-CM

## 2016-11-12 DIAGNOSIS — H538 Other visual disturbances: Secondary | ICD-10-CM | POA: Diagnosis not present

## 2016-11-12 DIAGNOSIS — R5383 Other fatigue: Secondary | ICD-10-CM

## 2016-11-12 DIAGNOSIS — R42 Dizziness and giddiness: Secondary | ICD-10-CM

## 2016-11-12 DIAGNOSIS — F32A Depression, unspecified: Secondary | ICD-10-CM

## 2016-11-12 MED ORDER — SERTRALINE HCL 50 MG PO TABS: 50.0000 mg | ORAL_TABLET | Freq: Every day | ORAL | 1 refills | Status: DC

## 2016-11-12 NOTE — Progress Notes (Signed)
Subjective:  By signing my name below, I, Richard Fisher, attest that this documentation has been prepared under the direction and in the presence of Richard Ray, MD. Electronically Signed: [name], [place]. 11/12/2016 , 12:40 PM .  Patient was seen in Room 10 .   Patient ID: Richard Fisher, male    DOB: May 30, 1971, 46 y.o.   MRN: [id number] Chief Complaint  Patient presents with  . Follow-up    per patient, went to ER a few days ago after feeling faint  . Testicle Pain    per patient, has felt a "hardening" in his testicle for several months; has seen urology   HPI Richard Fisher is a 46 y.o. male Here for follow up.   Fatigue with lightheadedness and blurry vision Last seen by me on June 4th. He was seen by his ophthalmologist, reportedly no concerning findings without any significant changes since April. He was adivsed to use his glasses. He was seen in the ER on June 8th with fatigue. He had a head CT, chest xray, CMP, troponin, CbC, EKG and UA. 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 He does have an appointment with neurology on July 12th Dr. Rexene Fisher.   Patient states he's been experiencing lightheadedness for the past few months, usually when he's out and moving about. He had an episode of lightheadedness when walking around [employer] center with his daughter a few days ago. He denies having any chest pains when these episodes of lightheadedness occur.   Depression He's been feeling depressed for about 2-3 months, starting from his first ER visit. He plans to meet with a psychologist. He denies being informed of being bipolar in the past. His brother takes zoloft without any difficulties.    Depression screen [facility] 2/9 11/12/2016 11/12/2016 11/05/2016 08/09/2016 06/14/2016  Decreased Interest 0 0 0 0 0  Down, Depressed, Hopeless 3 0 0 0 0  PHQ - 2 Score 3 0 0 0 0  Altered sleeping 3 - - - -  Tired, decreased energy 3 - - - -  Change in appetite 3 - - - -  Feeling bad or failure about yourself  0 - - - -  Trouble concentrating 3 - - - -  Moving slowly or fidgety/restless 3 - - - -  Suicidal thoughts 0 - - - -  PHQ-9 Score 18 - - - -  Difficult doing work/chores Extremely dIfficult - - - -    Testicular pain He was seen in the ER again on June 9th with symptoms of anxiety at that time. 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 He had a normal scrotum and testicular exam in the ER. He was recommended to follow up with his primary care. He was treated for prostatitis by urology in [facility]. Initially, in April 18th and then follow up on June 5th. During his visit on June 5th, his prostatitis was listed as resolved.   He was seen by urologist about 6 days ago and checked on his progress. He denies any more discomfort with ejaculation. But he did not inform his urologist about the testicular pain. He describes testicular pain, L>R in discomfort th started about a week ago. He denies fever, nausea or  vomiting. He denies any new sexual partners, as he hasn't had sex in months.   There are no active problems to display for this patient.  Past Medical History:  Diagnosis Date  . Anxiety   . Fatigue   . Melanoma ([place])    No past surgical history on file. Allergies  Allergen Reactions  . Amoxicillin Hives  . Levaquin [Levofloxacin In D5w] Other (See Comments)    Blurry vision after two doses taken    Prior to Admission medications   Medication Sig Start Date End Date Taking? Authorizing Provider  Cholecalciferol (VITAMIN D) 2000 units CAPS Take 1 capsule by mouth daily as needed.    Yes [provider]  VITAMIN K PO Take 1 tablet by mouth daily.   Yes  [provider]  magnesium 30 MG tablet Take 30 mg by mouth 2 (two) times a week.    [provider]  sildenafil (VIAGRA) 100 MG tablet Take 100 mg by mouth daily as needed for erectile dysfunction.    [provider]   Social History   Social History  . Marital status: Divorced    Spouse name: N/A  . Number of children: 4  . Years of education: BS   Occupational History  . Not on file.   Social History Main Topics  . Smoking status: Never Smoker  . Smokeless tobacco: Never Used  . Alcohol use No  . Drug use: No  . Sexual activity: Yes   Other Topics Concern  . Not on file   Social History Narrative   Drinks 1 caffeine drink a day    Review of Systems  Constitutional: Positive for fatigue. Negative for unexpected weight change.  Eyes: Negative for visual disturbance.  Respiratory: Negative for cough, chest tightness and shortness of breath.   Cardiovascular: Negative for chest pain, palpitations and leg swelling.  Gastrointestinal: Negative for abdominal pain, Fisher in stool, nausea and vomiting.  Genitourinary: Positive for testicular pain.  Neurological: Positive for light-headedness. Negative for dizziness and headaches.  Psychiatric/Behavioral: Positive for dysphoric mood. The patient is nervous/anxious.        Objective:   Physical Exam  Constitutional: He is oriented to person, place, and time. He appears well-developed and well-nourished. No distress.  HENT:  Head: Normocephalic and atraumatic.  Eyes: EOM are normal. Pupils are equal, round, and reactive to light.  Neck: Neck supple.  Cardiovascular: Normal rate.   Pulmonary/Chest: Effort normal. No respiratory distress.  Abdominal: Soft. Bowel sounds are normal. He exhibits no distension. There is no tenderness. Hernia confirmed negative in the right inguinal area and confirmed negative in the left inguinal area.  Genitourinary: Right testis shows no mass and no tenderness. Left  testis shows no mass and no tenderness.  Musculoskeletal: Normal range of motion.  Neurological: He is alert and oriented to person, place, and time.  Skin: Skin is warm and dry.  Psychiatric: He has a normal mood and affect. His behavior is normal.  Nursing note and vitals reviewed.   Vitals:   11/12/16 1135  BP: 133/80  Pulse: 70  Resp: 16  Temp: 98.2 F (36.8 C)  TempSrc: Oral  SpO2: 97%  Weight: 192 lb (87.1 kg)  Height: 5' 11"  (1.803 m)      Assessment & Plan:   Richard Fisher is a 46 y.o. male Episodic lightheadedness - Plan: Ambulatory referral to Cardiology Blurry vision,  Fatigue, unspecified type Nonspecific abnormal electrocardiogram (ECG) (EKG) - Plan: Ambulatory referral to Cardiology  Intermittent lightheadedness with blurry vision.   -CT head with slight abnormality noted, message sent to neurologist who he will be following up with in July to see if any specific testing recommended including MRI with or without contrast.Nonfocal neurologic exam at present  -Refer to cardiology with borderline abnormal EKG, and near syncopal/lightheadedness symptoms as above. ER/911 precautions if acute worsening.  Depression, unspecified depression type - Plan: sertraline (ZOLOFT) 50 MG tablet  -Suspect some component of depression and anxiety, decided to start SSRI. Zoloft 50 mg daily, with potential initial side effects discussed. Recheck in 3 weeks to assess control.  Testicular pain, unspecified - Plan: US Scrotum, [place] Art/Ven Flow Abd Pelv Doppler  -No concerning findings on exam. Reported history of spermatocele. Will check scrotal ultrasound, then possible repeat urology evaluation if testicular symptoms persist  Meds ordered this encounter  Medications  . sertraline (ZOLOFT) 50 MG tablet    Sig: Take 1 tablet (50 mg total) by mouth daily.    Dispense:  30 tablet    Refill:  1   Patient Instructions   Start Zoloft once per day. I referred you to cardiology, but if  you have return of lightheaded dizziness symptoms such as last Friday, be seen here or the emergency room if needed. I will discuss your CAT scan results with neurology to determine if other testing needed prior to your upcoming appointment. Return to the clinic or go to the nearest emergency room if any of your symptoms worsen or new symptoms occur.     IF you received an x-Fisher today, you will receive an invoice from [facility]. Please contact [facility] at [phone] with questions or concerns regarding your invoice.   IF you received labwork today, you will receive an invoice from [place]. Please contact [facility] at [phone] with questions or concerns regarding your invoice.   Our billing staff will not be able to assist you with questions regarding bills from these companies.  You will be contacted with the lab results as soon as they are available. The fastest way to get your results is to activate your My Chart account. Instructions are located on the last page of this paperwork. If you have not heard from [place] regarding the results in 2 weeks, please contact this office.      I personally performed the services described in this documentation, which was scribed in my presence. The recorded information has been reviewed and considered for accuracy and completeness, addended by me as needed, and agree with information above.  Signed,   Richard Ray, MD [facility].  11/14/16 3:39 PM

## 2016-11-12 NOTE — Patient Instructions (Addendum)
Start Zoloft once per day. I referred you to cardiology, but if you have return of lightheaded dizziness symptoms such as last Friday, be seen here or the emergency room if needed. I will discuss your CAT scan results with neurology to determine if other testing needed prior to your upcoming appointment. Return to the clinic or go to the nearest emergency room if any of your symptoms worsen or new symptoms occur.     IF you received an x-ray today, you will receive an invoice from [facility]. Please contact [facility] at [phone] with questions or concerns regarding your invoice.   IF you received labwork today, you will receive an invoice from [facility]. Please contact [facility] at [phone] with questions or concerns regarding your invoice.   Our billing staff will not be able to assist you with questions regarding bills from these companies.  You will be contacted with the lab results as soon as they are available. The fastest way to get your results is to activate your My Chart account. Instructions are located on the last page of this paperwork. If you have not heard from [place] regarding the results in 2 weeks, please contact this office.

## 2016-11-15 ENCOUNTER — Emergency Department (HOSPITAL_COMMUNITY): Payer: BLUE CROSS/BLUE SHIELD

## 2016-11-15 ENCOUNTER — Encounter (HOSPITAL_COMMUNITY): Payer: Self-pay | Admitting: Emergency Medicine

## 2016-11-15 ENCOUNTER — Emergency Department (HOSPITAL_COMMUNITY)
Admission: EM | Admit: 2016-11-15 | Discharge: 2016-11-15 | Disposition: A | Discharge status: Home / Self Care | Payer: BLUE CROSS/BLUE SHIELD | Attending: Emergency Medicine | Admitting: Emergency Medicine

## 2016-11-15 ENCOUNTER — Encounter: Payer: Self-pay | Admitting: Family Medicine

## 2016-11-15 DIAGNOSIS — I861 Scrotal varices: Secondary | ICD-10-CM | POA: Diagnosis not present

## 2016-11-15 DIAGNOSIS — Z85820 Personal history of malignant melanoma of skin: Secondary | ICD-10-CM | POA: Insufficient documentation

## 2016-11-15 DIAGNOSIS — R930 Abnormal findings on diagnostic imaging of skull and head, not elsewhere classified: Secondary | ICD-10-CM

## 2016-11-15 DIAGNOSIS — N50812 Left testicular pain: Secondary | ICD-10-CM | POA: Diagnosis not present

## 2016-11-15 DIAGNOSIS — M25552 Pain in left hip: Secondary | ICD-10-CM | POA: Diagnosis not present

## 2016-11-15 DIAGNOSIS — Z79899 Other long term (current) drug therapy: Secondary | ICD-10-CM | POA: Diagnosis not present

## 2016-11-15 DIAGNOSIS — R42 Dizziness and giddiness: Secondary | ICD-10-CM

## 2016-11-15 DIAGNOSIS — H538 Other visual disturbances: Secondary | ICD-10-CM

## 2016-11-15 DIAGNOSIS — N50819 Testicular pain, unspecified: Secondary | ICD-10-CM | POA: Diagnosis not present

## 2016-11-15 LAB — URINALYSIS, ROUTINE W REFLEX MICROSCOPIC
BACTERIA UA: NONE SEEN
Bilirubin Urine: NEGATIVE
Glucose, UA: NEGATIVE mg/dL
Ketones, ur: 5 mg/dL — AB
Leukocytes, UA: NEGATIVE
Nitrite: NEGATIVE
PROTEIN: NEGATIVE mg/dL
SPECIFIC GRAVITY, URINE: 1.014 (ref 1.005–1.030)
pH: 6 (ref 5.0–8.0)

## 2016-11-15 LAB — CBC WITH DIFFERENTIAL/PLATELET
BASOS ABS: 0 10*3/uL (ref 0.0–0.1)
Basophils Relative: 0 %
EOS ABS: 0.7 10*3/uL (ref 0.0–0.7)
Eosinophils Relative: 7 %
HCT: 41.5 % (ref 39.0–52.0)
HEMOGLOBIN: 14.5 g/dL (ref 13.0–17.0)
LYMPHS ABS: 2.3 10*3/uL (ref 0.7–4.0)
Lymphocytes Relative: 24 %
MCH: 30.8 pg (ref 26.0–34.0)
MCHC: 34.9 g/dL (ref 30.0–36.0)
MCV: 88.1 fL (ref 78.0–100.0)
Monocytes Absolute: 0.7 10*3/uL (ref 0.1–1.0)
Monocytes Relative: 7 %
NEUTROS PCT: 62 %
Neutro Abs: 6 10*3/uL (ref 1.7–7.7)
Platelets: 249 10*3/uL (ref 150–400)
RBC: 4.71 MIL/uL (ref 4.22–5.81)
RDW: 12.4 % (ref 11.5–15.5)
WBC: 9.6 10*3/uL (ref 4.0–10.5)

## 2016-11-15 LAB — COMPREHENSIVE METABOLIC PANEL
ALBUMIN: 4.2 g/dL (ref 3.5–5.0)
ALK PHOS: 42 U/L (ref 38–126)
ALT: 15 U/L — AB (ref 17–63)
AST: 16 U/L (ref 15–41)
Anion gap: 8 (ref 5–15)
BUN: 16 mg/dL (ref 6–20)
CALCIUM: 9.4 mg/dL (ref 8.9–10.3)
CO2: 26 mmol/L (ref 22–32)
Chloride: 105 mmol/L (ref 101–111)
Creatinine, Ser: 0.86 mg/dL (ref 0.61–1.24)
GFR calc non Af Amer: >60 mL/min (ref >60)
GLUCOSE: 110 mg/dL — AB (ref 65–99)
Potassium: 3.4 mmol/L — ABNORMAL LOW (ref 3.5–5.1)
SODIUM: 139 mmol/L (ref 135–145)
Total Bilirubin: 1 mg/dL (ref 0.3–1.2)
Total Protein: 7 g/dL (ref 6.5–8.1)

## 2016-11-15 MED ORDER — KETOROLAC TROMETHAMINE 30 MG/ML IJ SOLN
30.0000 mg | Freq: Once | INTRAMUSCULAR | Status: AC
  Administered 2016-11-15: 30 mg via INTRAVENOUS
  Filled 2016-11-15: qty 1

## 2016-11-15 NOTE — ED Triage Notes (Signed)
Pt states that he was seen here twice this past weekend about dizziness and testicle pain,  No more complaints of dizziness but continues to have testicle pains, saw PCP on Monday and has [place] scheduled, pain too much to wait

## 2016-11-15 NOTE — ED Provider Notes (Signed)
[place] DEPT Provider Note   CSN: [id number] Arrival date & time: 11/15/16  1928     History   Chief Complaint Chief Complaint  Patient presents with  . Testicle Pain    HPI Richard Fisher is a 46 y.o. male.  HPI 46 year old male who presents with fatigue, intermittent lightheadedness, and left testicular pain. Was diagnosed with spermatocele 3 months ago and following with urologist. Pain controlled with anti-inflammatory medications. Worsening left testicular pain over past 2 weeks, not improved with ibuprofen. Radiates up to the left lower abdomen, worse with activity and standing, improved with sitting. Has been having decreased appetite for some time with fatigue and lightheadedness as well. With very lightheaded yesterday, but now resolved. No syncope, chest pain or difficulty breathing. Today woke up with left hip pain, worse with movement and ambulation, that or when resting with external rotation. No fall or injury. No heavy lifting or exertional activity. Does state that he walks a lot, up to 20 miles per day.   No fever, chills, night sweats, n/v/d,melena, hematochezia, dysuria, urinary frequency, hematuria. No chest pain, dyspnea. States he has been very worried, and paid out of pocket for blood work he ordered himself, including EBV, CMV, basic blood work, and testosterone levels. Seen by PCP and ordered outpatient [place] of scrotum which has not been performed. No tick bites or rash.   Past Medical History:  Diagnosis Date  . Anxiety   . Fatigue   . Melanoma ([place])     There are no active problems to display for this patient.   History reviewed. No pertinent surgical history.     Home Medications    Prior to Admission medications   Medication Sig Start Date End Date Taking? Authorizing Provider  Cholecalciferol (VITAMIN D) 2000 units CAPS Take 1 capsule by mouth daily as needed.    Yes [provider]  L-Theanine 100 MG CAPS Take 1 tablet by mouth  daily.   Yes [provider]  magnesium 30 MG tablet Take 30 mg by mouth 2 (two) times a week.   Yes [provider]  Multiple Vitamins-Minerals (ZINC PO) Take 1 tablet by mouth daily.   Yes [provider]  sertraline (ZOLOFT) 50 MG tablet Take 1 tablet (50 mg total) by mouth daily. 11/12/16  Yes [name], MD  sildenafil (VIAGRA) 100 MG tablet Take 100 mg by mouth daily as needed for erectile dysfunction.   Yes [provider]  VITAMIN K PO Take 1 tablet by mouth daily.   Yes [provider]    Family History History reviewed. No pertinent family history.  Social History Social History  Substance Use Topics  . Smoking status: Never Smoker  . Smokeless tobacco: Never Used  . Alcohol use No     Allergies   Amoxicillin and Levaquin [levofloxacin in d5w]   Review of Systems Review of Systems  Constitutional: Negative for fever.  Respiratory: Negative for shortness of breath.   Cardiovascular: Negative for chest pain.  Genitourinary: Positive for scrotal swelling and testicular pain. Negative for dysuria, flank pain and frequency.  Musculoskeletal: Negative for back pain.  Skin: Negative for rash.  Allergic/Immunologic: Negative for immunocompromised state.  Neurological: Positive for light-headedness.  Hematological: Does not bruise/bleed easily.  Psychiatric/Behavioral: Negative for confusion.  All other systems reviewed and are negative.    Physical Exam Updated Vital Signs BP 123/82   Pulse (!) 47   Temp 97.9 F (36.6 C) (Oral)  Resp 15   Ht 5' 11"  (1.803 m)   Wt 87.1 kg (192 lb)   SpO2 97%   BMI 26.78 kg/m   Physical Exam Physical Exam  Nursing note and vitals reviewed. Constitutional: Well developed, well nourished, non-toxic, and in no acute distress Head: Normocephalic and atraumatic.  Mouth/Throat: Oropharynx is clear and moist.  Neck: Normal range of motion. Neck supple.  Cardiovascular: Normal  rate and regular rhythm.   Pulmonary/Chest: Effort normal and breath sounds normal.  Abdominal: Soft. There is no tenderness. There is no rebound and no guarding. no CVA tenderness GU: fullness of left spermatic cord will mild discomfort to palpation. No overlying scrotal skin changes. Normal testicular lie. Normal cremasteric reflexes.  Musculoskeletal: Normal range of motion.  Neurological: Alert, no facial droop, fluent speech, moves all extremities symmetrically Skin: Skin is warm and dry.  Psychiatric: Cooperative   ED Treatments / Results  Labs (all labs ordered are listed, but only abnormal results are displayed) Labs Reviewed  COMPREHENSIVE METABOLIC PANEL - Abnormal; Notable for the following:       Result Value   Potassium 3.4 (*)    Glucose, Bld 110 (*)    ALT 15 (*)    All other components within normal limits  URINALYSIS, ROUTINE W REFLEX MICROSCOPIC - Abnormal; Notable for the following:    Hgb urine dipstick SMALL (*)    Ketones, ur 5 (*)    Squamous Epithelial / LPF 0-5 (*)    All other components within normal limits  CBC WITH DIFFERENTIAL/PLATELET  B. BURGDORFI ANTIBODIES  ROCKY MTN SPOTTED FVR ABS PNL(IGG+IGM)    EKG  EKG Interpretation  Date/Time:  Thursday November 15 2016 21:01:33 EDT Ventricular Rate:  60 PR Interval:    QRS Duration: 94 QT Interval:  426 QTC Calculation: 426 R Axis:   73 Text Interpretation:  Sinus rhythm no acute changes  Confirmed by Brantley Stage [phone]) on 11/15/2016 9:23:11 PM       Radiology US Scrotum  Result Date: 11/15/2016 CLINICAL DATA:  Testicle pain for 2 weeks left greater than right EXAM: SCROTAL ULTRASOUND DOPPLER ULTRASOUND OF THE TESTICLES TECHNIQUE: Complete ultrasound examination of the testicles, epididymis, and other scrotal structures was performed. Color and spectral Doppler ultrasound were also utilized to evaluate blood flow to the testicles. COMPARISON:  08/11/2016 FINDINGS: Right testicle Measurements: 4.1 x  1.8 x 2.8 cm. No mass or microlithiasis visualized. Left testicle Measurements: 4.2 x 2 x 3.0 cm. No mass or microlithiasis visualized. Right epididymis:  Normal in size and appearance. Left epididymis:  Normal in size and appearance. Hydrocele:  None visualized. Varicocele:  Small left varicocele. Pulsed Doppler interrogation of both testes demonstrates normal low resistance arterial and venous waveforms bilaterally. IMPRESSION: 1. Negative for testicular torsion or testicular mass 2. Small left varicocele Electronically Signed   By: Donavan Foil M.D.   On: 11/15/2016 22:13   [place] Art/ven Flow Abd Pelv Doppler  Result Date: 11/15/2016 CLINICAL DATA:  Testicle pain for 2 weeks left greater than right EXAM: SCROTAL ULTRASOUND DOPPLER ULTRASOUND OF THE TESTICLES TECHNIQUE: Complete ultrasound examination of the testicles, epididymis, and other scrotal structures was performed. Color and spectral Doppler ultrasound were also utilized to evaluate blood flow to the testicles. COMPARISON:  08/11/2016 FINDINGS: Right testicle Measurements: 4.1 x 1.8 x 2.8 cm. No mass or microlithiasis visualized. Left testicle Measurements: 4.2 x 2 x 3.0 cm. No mass or microlithiasis visualized. Right epididymis:  Normal in size and appearance. Left epididymis:  Normal in size and appearance. Hydrocele:  None visualized. Varicocele:  Small left varicocele. Pulsed Doppler interrogation of both testes demonstrates normal low resistance arterial and venous waveforms bilaterally. IMPRESSION: 1. Negative for testicular torsion or testicular mass 2. Small left varicocele Electronically Signed   By: Donavan Foil M.D.   On: 11/15/2016 22:13   Dg Hip Unilat W Or Wo Pelvis 2-3 Views Left  Result Date: 11/15/2016 CLINICAL DATA:  Left hip pain starting this morning, no injury. EXAM: DG HIP (WITH OR WITHOUT PELVIS) 2-3V LEFT COMPARISON:  None. FINDINGS: There is no evidence of hip fracture or dislocation. Minimal narrow bilateral hip joint  spaces are noted. IMPRESSION: No acute abnormality. Electronically Signed   By: Abelardo Diesel M.D.   On: 11/15/2016 20:44    Procedures Procedures (including critical care time)  Medications Ordered in ED Medications  ketorolac (TORADOL) 30 MG/ML injection 30 mg (30 mg Intravenous Given 11/15/16 2028)     Initial Impression / Assessment and Plan / ED Course  I have reviewed the triage vital signs and the nursing notes.  Pertinent labs & imaging results that were available during my care of the patient were reviewed by me and considered in my medical decision making (see chart for details).     Presenting with multiple complaints. Well appearing. Reassuring vitals.   Testicular pain 2/2 varicocele on ultrasound. No signs of torsion or infection. Has urology follow-up for this.   Lightheadedness suspected from decrease PO intake. EKG w/o stigmata of arrhythmia. UA with some ketones, which supports decrease PO.   Hip pain likely overuse pain. Xr visualized and w/o arthritis or fracture. No overlying skin changes to suggest infection.  Blood work reassuring. No major electrolyte or metabolic derangements. No anemia. Lyme and RMSF titers sent and he will have his PCP follow-up on this. Patient with ongoing symptoms of fatigue. His lab work which he bought himself reviewed. With normal testosterone levels. Thyroid studies done previously normal per patient. He has referral to GI and cardiology from his PCP for further work-up. Appropriate for continued outpatient work-up.   Strict return and follow-up instructions reviewed. He expressed understanding of all discharge instructions and felt comfortable with the plan of care.   Final Clinical Impressions(s) / ED Diagnoses   Final diagnoses:  Pain in left testicle  Varicocele    New Prescriptions New Prescriptions   No medications on file     Forde Dandy, MD 11/15/16 [phone]

## 2016-11-15 NOTE — ED Notes (Signed)
Pt ambulatory to waiting room. Pt verbalized understanding of discharge instructions.   

## 2016-11-15 NOTE — Discharge Instructions (Addendum)
Your work-up is overall reassuring. Your scrotal ultrasound shows varicocele. Please follow-up with your urologist on Tuesday as scheduled. Take ibuprofen 600-800 mg every 6 hours as needed for pain and/or tylenol 1000 mg every 6 hours as needed to pain. Take with food or crackers. Your urine suggest dehydration, which can lead to dizziness. Please drink fluids and eat well. Continue to follow-up with your PCP for ongoing work-up, and please have him check your lyme titers and RMSF titers in a few days. Please see the specialists your PCP have referred for ongoing work-up as well.

## 2016-11-15 NOTE — ED Notes (Signed)
Patient transported to Ultrasound 

## 2016-11-16 ENCOUNTER — Encounter: Payer: Self-pay | Admitting: Family Medicine

## 2016-11-17 LAB — B. BURGDORFI ANTIBODIES

## 2016-11-20 ENCOUNTER — Ambulatory Visit: Payer: Self-pay | Admitting: Neurology

## 2016-11-20 DIAGNOSIS — N50812 Left testicular pain: Secondary | ICD-10-CM | POA: Diagnosis not present

## 2016-11-20 DIAGNOSIS — G8929 Other chronic pain: Secondary | ICD-10-CM | POA: Diagnosis not present

## 2016-11-20 LAB — ROCKY MTN SPOTTED FVR ABS PNL(IGG+IGM)
RMSF IgG: NEGATIVE
RMSF IgM: 0.27 index (ref 0.00–0.89)

## 2016-11-27 ENCOUNTER — Encounter: Payer: Self-pay | Admitting: Cardiovascular Disease

## 2016-11-27 ENCOUNTER — Ambulatory Visit (INDEPENDENT_AMBULATORY_CARE_PROVIDER_SITE_OTHER): Payer: BLUE CROSS/BLUE SHIELD | Admitting: Cardiovascular Disease

## 2016-11-27 DIAGNOSIS — R42 Dizziness and giddiness: Secondary | ICD-10-CM

## 2016-11-27 NOTE — Progress Notes (Signed)
11/27/2016 Richard Fisher   1971-03-10  [id number]  Primary Physician [name], MD Primary Cardiologist: Lorretta Harp MD Renae Gloss  HPI:  Richard Fisher is a delightful 46 year old single Caucasian male father of 4 children who works in Occupational [employer]. He was referred by Dr. Nyoka Cowden for cardiovascular evaluation because of an episode of dizziness. He has no cardiac risk factors. He is basically on no medications. He has had stage I melanoma which has had a wide excision with negative margins. He says he has had recent episodes of anxiety. He was seen in the emergency room 11/15/16 with testicular pain. He was complaining of some dizziness as well. His EKG at that time was normal as is his EKG today.   Current Outpatient Prescriptions  Medication Sig Dispense Refill  . sildenafil (VIAGRA) 100 MG tablet Take 100 mg by mouth daily as needed for erectile dysfunction.     No current facility-administered medications for this visit.     Allergies  Allergen Reactions  . Amoxicillin Hives  . Levaquin [Levofloxacin In D5w] Other (See Comments)    Blurry vision after two doses taken     Social History   Social History  . Marital status: Divorced    Spouse name: N/A  . Number of children: 4  . Years of education: BS   Occupational History  . Not on file.   Social History Main Topics  . Smoking status: Never Smoker  . Smokeless tobacco: Never Used  . Alcohol use No  . Drug use: No  . Sexual activity: Yes   Other Topics Concern  . Not on file   Social History Narrative   Drinks 1 caffeine drink a day      Review of Systems: General: negative for chills, fever, night sweats or weight changes.  Cardiovascular: negative for chest pain, dyspnea on exertion, edema, orthopnea, palpitations, paroxysmal nocturnal dyspnea or shortness of breath Dermatological: negative for rash Respiratory: negative for cough or wheezing Urologic: negative for  hematuria Abdominal: negative for nausea, vomiting, diarrhea, bright red blood per rectum, melena, or hematemesis Neurologic: negative for visual changes, syncope, or dizziness All other systems reviewed and are otherwise negative except as noted above.    Blood pressure 122/86, pulse 60, height 5' 11"  (1.803 m), weight 203 lb 9.6 oz (92.4 kg).  General appearance: alert and no distress Neck: no adenopathy, no carotid bruit, no JVD, supple, symmetrical, trachea midline and thyroid not enlarged, symmetric, no tenderness/mass/nodules Lungs: clear to auscultation bilaterally Heart: regular rate and rhythm, S1, S2 normal, no murmur, click, rub or gallop Extremities: extremities normal, atraumatic, no cyanosis or edema  EKG sinus rhythm at 60 without ST or T-wave changes. I personally reviewed this EKG  ASSESSMENT AND PLAN:   Dizziness Jaimen was referred by his primary care doctor, Shea Stakes, for evaluation of dizziness. He was recently seen in the ER first testicular pain and was noted to be dizzy for no clear reason. He has no carotid cardiovascular risk factors. His EKG at that time was normal as is his EKG today. He's had no further episodes of dizziness. This may have just been in the setting of pain. He denies chest pain or shortness of breath. Blood pressure today is 122/86. At this point, I do not see any cardiovascular etiology. There are no tests I will order currently. I will see him back in 6 months.      Lorretta Harp MD [place], [facility] 11/27/2016  2:34 PM

## 2016-11-27 NOTE — Assessment & Plan Note (Signed)
Richard Fisher was referred by his primary care doctor, Richard Fisher, for evaluation of dizziness. He was recently seen in the ER first testicular pain and was noted to be dizzy for no clear reason. He has no carotid cardiovascular risk factors. His EKG at that time was normal as is his EKG today. He's had no further episodes of dizziness. This may have just been in the setting of pain. He denies chest pain or shortness of breath. Blood pressure today is 122/86. At this point, I do not see any cardiovascular etiology. There are no tests I will order currently. I will see him back in 6 months.

## 2016-11-27 NOTE — Patient Instructions (Signed)
Medication Instructions: Your physician recommends that you continue on your current medications as directed. Please refer to the Current Medication list given to you today.  Follow-Up: Your physician wants you to follow-up in: 6 months with Dr. [name]. You will receive a reminder letter in the mail two months in advance. If you don't receive a letter, please call our office to schedule the follow-up appointment.  If you need a refill on your cardiac medications before your next appointment, please call your pharmacy.

## 2016-11-28 ENCOUNTER — Encounter: Payer: Self-pay | Admitting: Family Medicine

## 2016-12-03 ENCOUNTER — Encounter: Payer: Self-pay | Admitting: Family Medicine

## 2016-12-06 ENCOUNTER — Telehealth: Payer: Self-pay | Admitting: Family Medicine

## 2016-12-06 NOTE — Telephone Encounter (Signed)
Prior authorization initiated for pt's MRI Brain with and without contrast scheduled at [facility] on 12/08/16. Prior authorization was not approved and [employer] is requesting [employer] be contacted for more clinical information. No case number given. If unable to get approval before 12/08/16, we will need to contact [facility] at [phone] so they can reschedule pt. Please advise. Thanks!

## 2016-12-07 DIAGNOSIS — Z87898 Personal history of other specified conditions: Secondary | ICD-10-CM | POA: Diagnosis not present

## 2016-12-07 NOTE — Telephone Encounter (Signed)
Called [facility] and let them know we are waiting on insurance authorization for pt MRI and the provider would be back in next week so they will reschedule the pt's appt once that is received. I called the pt to let him know this as well and he was very understanding of the insurance process and said he is fine to wait until we can call him back to let him know if the MRI is authorized or not.

## 2016-12-08 ENCOUNTER — Other Ambulatory Visit: Payer: BLUE CROSS/BLUE SHIELD

## 2016-12-10 NOTE — Telephone Encounter (Signed)
Are we still waiting on prior auth?

## 2016-12-11 NOTE — Telephone Encounter (Signed)
Yes, we will still need to provide additional clinical information to [employer] to see if this can get approved.

## 2016-12-13 ENCOUNTER — Ambulatory Visit (INDEPENDENT_AMBULATORY_CARE_PROVIDER_SITE_OTHER): Payer: BLUE CROSS/BLUE SHIELD | Admitting: Neurology

## 2016-12-13 ENCOUNTER — Encounter: Payer: Self-pay | Admitting: Neurology

## 2016-12-13 VITALS — BP 128/87 | HR 62 | Ht 71.0 in | Wt 202.0 lb

## 2016-12-13 DIAGNOSIS — G4761 Periodic limb movement disorder: Secondary | ICD-10-CM

## 2016-12-13 DIAGNOSIS — G479 Sleep disorder, unspecified: Secondary | ICD-10-CM | POA: Diagnosis not present

## 2016-12-13 DIAGNOSIS — R400 Somnolence: Secondary | ICD-10-CM | POA: Diagnosis not present

## 2016-12-13 NOTE — Progress Notes (Signed)
Subjective:    Patient ID: Richard Fisher is a 46 y.o. male.  HPI     Interim history:   Richard Fisher is a 46 year old right-handed gentleman with an underlying medical history of ED, anxiety, overweight state and skin cancer, who presents for follow-up consultation after his recent sleep study testing. The patient is unaccompanied today. I first met him on 06/14/2016 at the request of his primary care physician, at which time he reported daytime somnolence since his teenage years. He was utilizing modafinil off and on which he purchased online. I invited him for sleep study testing. He had a baseline sleep study, followed by a next day nap study. I talked to him about his test results in detail today. Baseline sleep study from 08/20/2016 showed a sleep efficiency reduced at 62.6%, sleep latency delayed at 30.5 minutes and REM latency was normal at 86.5 minutes. Wake after sleep onset was 186.5 minutes which is markedly increased with a long period of wakefulness and mild sleep fragmentation noted. He had increased percentage of stage II sleep, slow-wave sleep was adequate at 15.8% and REM sleep was normal at 23.2%. Average oxygen saturation was 97%, nadir was 83%. Total AHI was 1.5 per hour. 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 Next a MSLT showed a mean sleep latency for 5 naps of 8.2 minutes with no sleep onset REM periods. This was in the context of a decreased total sleep time of slightly less than 6 hours the night prior (TST 352 min) and overall findings were not supportive of narcolepsy or idiopathic hypersomnolence.  Today, 12/13/2016: He reports feeling better, since he started seeing urologist, who diagnosed him with hypothyroidism, and has been place on homeopathic thyroid medicine. He has been on this for the past 2 weeks and feels much better with regards to his fatigue, energy level and even anxiety. He was given a prescription for Zoloft by his primary  care physician, took it for 1 day and stopped it secondary to side effects, feeling "crappy". He still naps during the day but are not as long. He has no new complaints such as headache, one-sided weakness or numbness or tingling, feels less anxious. He reports "feeling like a million bucks". He denies RLS symptoms at this time, does have a tendency to bob his feet while sitting but can stop and does not feel like he has urges to move his legs and no symptoms particularly at night or in the evenings or while in bed trying to fall asleep.  He is noted to be somewhat irritated or frustrated today, reports having seen many doctors and having to pay labs and visits out of pocket and not getting any solutions. He is advised that he is getting some answers and knowing that some results are negative at least excludes things, which is helpful too. He is furthermore advised, that things may still continue to improve if he is diagnosed with hypothyroidism and on treatment for this, it takes time for thyroid medicine and levels to adjust.  The patient's allergies, current medications, family history, past medical history, past social history, past surgical history and problem list were reviewed and updated as appropriate.   Previously (copied from previous notes for reference):   06/14/2016: (He) reports excessive daytime somnolence his teenage years. I reviewed your office note from 05/15/2016. While his Epworth sleepiness score is reported by his assessment to be only 9 out of 24 today, I think he is  underreporting the degree of his sleepiness based on his overall sleep related history. He denies falling asleep at the wheel but has come close. He is a nonsmoker, does not currently drink any alcohol and has used marijuana intermittently. He has 4 children, ages 32, 22 year old twins and a 72 year old daughter. He lives with his girlfriend and his youngest daughter. He has his own [employer] as I understand. He  is not currently working on a daily basis.  Of note, he reports a bedtime between 10 and 11 PM and wakeup time around 5 or 6 AM but he does take prolonged naps, and a daily nap, lasting anywhere from 3:40 or 5 hours. If he had completed leisure he could easily sleep 10-12 hours at night he indicates. His fatigue score is 39 out of 63. 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 He denies hypnagogic or hypnopompic hallucinations and a family history of narcolepsy but interestingly his father has been known to nap every day and his 18 year old brother has a history of taking naps during the day. He has a total of 4 brothers and 1 sister. He denies restless leg symptoms or leg twitching at night. He denies sleep talking or sleep walking. He does not keep a very scheduled sleep time and wakeup time. He tries to exercise every day and is currently on a vegan diet. He drinks 1 cup of coffee per day. For the past 4 or 5 years he has been utilizing modafinil off and on which he purchased online. He feels that modafinil has been very helpful in helping him stay awake when he needed to. He does endorse falling asleep in class when he was in school. He had blood work on 05/04/2016 which I reviewed, TSH, ESR were unremarkable at the time and he had additional blood work today including CBC, CMP, vitamin D, testosterone level, with results pending. He does not snore typically, denies nocturia, but has had occasional morning headaches which are reportedly mild. He is not aware of any family history of obstructive sleep apnea. He does not report vivid dreams or dreaming a lot. He does sometimes dream during a prolonged nap.   His Past Medical History Is Significant For: Past Medical History:  Diagnosis Date  . Anxiety   . Fatigue   . Melanoma ([place])     His Past Surgical History Is Significant For: No past surgical history on file.  His Family History Is Significant For: No family  history on file.  His Social History Is Significant For: Social History   Social History  . Marital status: Divorced    Spouse name: N/A  . Number of children: 4  . Years of education: BS   Social History Main Topics  . Smoking status: Never Smoker  . Smokeless tobacco: Never Used  . Alcohol use No  . Drug use: No  . Sexual activity: Yes   Other Topics Concern  . None   Social History Narrative   Drinks 1 caffeine drink a day     His Allergies Are:  Allergies  Allergen Reactions  . Amoxicillin Hives  . Levaquin [Levofloxacin In D5w] Other (See Comments)    Blurry vision after two doses taken   :   His Current Medications Are:  Outpatient Encounter Prescriptions as of 12/13/2016  Medication Sig  . OVER THE COUNTER MEDICATION D-Ribosome Citrulline Grape Seed Extract Bilberry Tribulus  . sildenafil (VIAGRA) 100 MG tablet Take 100 mg by  mouth daily as needed for erectile dysfunction.   No facility-administered encounter medications on file as of 12/13/2016.   :  Review of Systems:  Out of a complete 14 point review of systems, all are reviewed and negative with the exception of these symptoms as listed below: Review of Systems  Neurological:       Pt presents today to discuss his sleep study results and subsequent treatment recommendations.    Objective:  Neurological Exam  Physical Exam Physical Examination:   Vitals:   12/13/16 1301  BP: 128/87  Pulse: 62   General Examination: The patient is a very pleasant 46 y.o. male in no acute distress. He appears well-developed and well-nourished and well groomed.    HEENT: Normocephalic, atraumatic, pupils are equal, round and reactive to light and accommodation. Extraocular tracking is good without limitation to gaze excursion or nystagmus noted. Normal smooth pursuit is noted. Hearing is grossly intact. Face is symmetric with normal facial animation and normal facial sensation. Speech is clear with no dysarthria  noted. There is no hypophonia. There is no lip, neck/head, jaw or voice tremor. Neck is supple with full range of passive and active motion. Oropharynx exam reveals: mild mouth dryness, good dental hygiene and mild airway crowding, Mallampati is class I. Tongue protrudes centrally and palate elevates symmetrically.   Chest: Clear to auscultation without wheezing, rhonchi or crackles noted.  Heart: S1+S2+0, regular and normal without murmurs, rubs or gallops noted.   Abdomen: Soft, non-tender and non-distended with normal bowel sounds appreciated on auscultation.  Extremities: There is no pitting edema in the distal lower extremities bilaterally. Pedal pulses are intact.  Skin: Warm and dry without trophic changes noted.   Musculoskeletal: exam reveals no obvious changes.    Neurologically:  Mental status: The patient is awake, alert and oriented in all 4 spheres. His immediate and remote memory, attention, language skills and fund of knowledge are appropriate. There is no evidence of aphasia, agnosia, apraxia or anomia. Speech is clear with normal prosody and enunciation, slightly pressured speech. Thought process is linear. Mood is normal and affect is normal, appears frustrated at times.  Cranial nerves II - XII are as described above under HEENT exam. In addition: shoulder shrug is normal with equal shoulder height noted. Motor exam: Normal bulk, strength and tone is noted. There is no drift, tremor or rebound. Romberg is negative. Reflexes are 2+ throughout. Fine motor skills and coordination: intact with normal finger taps, normal hand movements, normal rapid alternating patting, normal foot taps and normal foot agility.  Cerebellar testing: No dysmetria or intention tremor on finger to nose testing. Heel to shin is unremarkable bilaterally. There is no truncal or gait ataxia.  Sensory exam: intact to light touch and temperature sense in the upper and lower extremities.  Gait, station  and balance: He stands easily. No veering to one side is noted. No leaning to one side is noted. Posture is age-appropriate and stance is narrow based. Gait shows normal stride length and normal pace. No problems turning are noted. Tandem walk is unremarkable.  Assessment and Plan:   In summary, Kelsie Kramp is a very pleasant 46 year old male who presents for follow-up consultation of his sleep disturbance. He had recent sleep study testing with a nighttime sleep study, followed by a daytime nap study. Findings were not supportive for narcolepsy or idiopathic hypersomnolence, nighttime sleep study showed decreased sleep efficiency, increase in wake after sleep onset but overall sleep architecture was okay in  terms of achieving all stages of sleep in physiological percentages. There was nonspecific daytime sleepiness, most likely accounted for by decreased sleep efficiency and total sleep time at night. He had mild PLMS during the nocturnal polysomnogram without significant arousals. I talked to him about his test results in detail today. Physical exam and neurological exam are nonfocal, and he is reassured in that regard. Recently, he started feeling better in the past couple weeks after he started thyroid medication. He is encouraged to talk to his primary care physician about his underlying anxiety but since he feels improved recently he is not keen on trying any new medication for this, had tried Zoloft for 1 day but stopped it after not feeling good after it. We talked about sleep hygiene today, he is advised to try to carve out time 4 at least 8 hours of sleep at night and perhaps a short nap if possible during the day, he is encouraged to utilize caffeine in limitation. From my end of things I suggested as needed follow-up, I explained to him that a stimulant medication or nonsteroidal and wake promoting agent such as Provigil are not indicated from my end of things. I answered all his questions today  and he demonstrated understanding.  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 Pertinent laboratory and imaging test results that were available during this visit with the patient were reviewed by me and considered in my medical decision making (see chart for details).

## 2016-12-20 ENCOUNTER — Ambulatory Visit (INDEPENDENT_AMBULATORY_CARE_PROVIDER_SITE_OTHER): Payer: BLUE CROSS/BLUE SHIELD | Admitting: Family Medicine

## 2016-12-20 ENCOUNTER — Encounter: Payer: Self-pay | Admitting: Family Medicine

## 2016-12-20 VITALS — BP 135/83 | HR 68 | Temp 97.9°F | Resp 16 | Ht 71.0 in | Wt 203.0 lb

## 2016-12-20 DIAGNOSIS — F411 Generalized anxiety disorder: Secondary | ICD-10-CM

## 2016-12-20 DIAGNOSIS — J029 Acute pharyngitis, unspecified: Secondary | ICD-10-CM | POA: Diagnosis not present

## 2016-12-20 DIAGNOSIS — D721 Eosinophilia, unspecified: Secondary | ICD-10-CM

## 2016-12-20 NOTE — Patient Instructions (Addendum)
I would generally not recommended the supplements including any thyroid supplements, adrenal supplements, hypothalamus supplements unless specifically low readings. as your last testing was not indicating thyroid testing.   If anxiety, panic symptoms return, would recommend either meeting with psychiatrist for formal testing to make sure we are treating the right condition, or possible trial of other SSRI. Would still recommend keeping up with the therapist.    Based on your prior elevated eosinophils, I will repeat that blood count today, but they were normal on both recent tests here.    I do not see any concerning signs on sore throat today or on your neck exam. Would recommend you discuss the soreness with chewing with your dentist and follow-up if any worsening symptoms.   Sore Throat A sore throat is pain, burning, irritation, or scratchiness in the throat. When you have a sore throat, you may feel pain or tenderness in your throat when you swallow or talk. Many things can cause a sore throat, including:  An infection.  Seasonal allergies.  Dryness in the air.  Irritants, such as smoke or pollution.  Gastroesophageal reflux disease (GERD).  A tumor.  A sore throat is often the first sign of another sickness. It may happen with other symptoms, such as coughing, sneezing, fever, and swollen neck glands. Most sore throats go away without medical treatment. Follow these instructions at home:  Take over-the-counter medicines only as told by your health care provider.  Drink enough fluids to keep your urine clear or pale yellow.  Rest as needed.  To help with pain, try: ? Sipping warm liquids, such as broth, herbal tea, or warm water. ? Eating or drinking cold or frozen liquids, such as frozen ice pops. ? Gargling with a salt-water mixture 3-4 times a day or as needed. To make a salt-water mixture, completely dissolve -1 tsp of salt in 1 cup of warm water. ? Sucking on hard  candy or throat lozenges. ? Putting a cool-mist humidifier in your bedroom at night to moisten the air. ? Sitting in the bathroom with the door closed for 5-10 minutes while you run hot water in the shower.  Do not use any tobacco products, such as cigarettes, chewing tobacco, and e-cigarettes. If you need help quitting, ask your health care provider. Contact a health care provider if:  You have a fever for more than 2-3 days.  You have symptoms that last (are persistent) for more than 2-3 days.  Your throat does not get better within 7 days.  You have a fever and your symptoms suddenly get worse. Get help right away if:  You have difficulty breathing.  You cannot swallow fluids, soft foods, or your saliva.  You have increased swelling in your throat or neck.  You have persistent nausea and vomiting. This information is not intended to replace advice given to you by your health care provider. Make sure you discuss any questions you have with your health care provider. Document Released: 06/28/2004 Document Revised: 01/15/2016 Document Reviewed: 03/11/2015 Elsevier Interactive Patient Education  2018 [employer].   IF you received an x-ray today, you will receive an invoice from [facility]. Please contact [facility] at [phone] with questions or concerns regarding your invoice.   IF you received labwork today, you will receive an invoice from [facility]. Please contact [facility] at [phone] with questions or concerns regarding your invoice.   Our billing staff will not be able to assist you with questions regarding bills from these  companies.  You will be contacted with the lab results as soon as they are available. The fastest way to get your results is to activate your My Chart account. Instructions are located on the last page of this paperwork. If you have not heard from [place] regarding the results in 2 weeks, please contact this office.

## 2016-12-20 NOTE — Progress Notes (Signed)
By signing my name below, I, Richard Fisher, attest that this documentation has been prepared under the direction and in the presence of Richard Ray, MD.  Electronically Signed: Verlee Fisher, Medical Scribe. 12/20/16. 4:25 PM.  Subjective:    Patient ID: Richard Fisher, male    DOB: 09-04-1970, 46 y.o.   MRN: [id number]  HPI Chief Complaint  Patient presents with  . Follow-up    wants to discuss labs    HPI Comments: Richard Fisher is a 46 y.o. male who presents to Primary Care at [facility] for follow-up. He was last seen June 11th with multiple concerns. Had previously been seen a week prior with fatigue light-headedness and blurry vision. He had a reassuring CT, CXR, CMP, troponin, CBC, EKG and urine testing on June 8th in the ER. He reported episodic light-headedness over the last few months, especially when walking around outside. Denies any associated chest pains. Did admit to some depression sxs with PHQ 9 score of 18 on June 11th. He had a non-specific EKG, referred to cardiology for near syncope ad light-headedness.s started zoloft 50 mg QD for anxiety and depression. His CT head did indicate slight abnormality of the left vertebral artery. Appointmentt July 12th with neuro, but was probably seen for sleep issues at that time. Notes were reviewed from July 12th visit. He had a sleep study with night time sleep study and daytime nap study. Findings did not support narcolepsy or hypersomnolence. Overall sleep architecture was okay. Per that note he only took zoloft for 1 day because he did not feel good taking it but reported improvement after starting thyroid medication (nl TSH on June 4th and I had not perscribed him any thyroid medication). No specific stimulant medication was recommended. It appears the MRI of brain is still orered but not completed. Based on notes were attempting to get clinical information to [facility] for approval. He has sent multiple emails regarding blood work he had done  outside of office. recommended he return and discuss those in further detail. Evaluation with cardiology June 6th. Sxs not thought to be cardiac origin. No other test recommended.  Pt discontinued zoloft due to feeling "weird" unlike he's ever felt before. [facility] test to figure out what was wrong with him. He also found out his eosinophils at 30%. Pt went to a urologist Richard Fisher in [place] (natural and allopathic practice), and he found out he had hypothyroidism. He rx lauricidin for eosinophils (coconut oil based) desiccated hypothalamus, adrenals, thyroid, and 4 other supplements that will be sent in later. In Jan his TSH was 1.85, in June his TSH was 2."something, and later it was 3.5. Pt no longer has fatigue, anxiety, testicular discomfort, or erectile dysfunction since starting his supplements. Pt also stopped his vegan diet mid June after recommendation and he's now eating various meats. Pt had his eyes checked out and he was told his blurry vision is age related. Pt's cardiologist, Dr. [name], didn't have any concerns during his last visit.   Sore Throat: Pt developed a sore throat over the last 2 months. When he belches, chews, or changes the "architecture" of his jaw he feels a non painful pressure on his right sided jaw. He also has pain on the left back of his neck. Pt's last dentist appt was 4-5 months ago. Denies tooth pain, temperature sensitivity, and fever.  Anxiety: Pt has been talking to psychologist, [name], for the past 2 months through [email or web address]. He hasn't  received much feedback from her, but she allows him to talk about his concerns. Pt hasn't had any anxiety, and his erectile dysfunction has resolved since starting his supplements. Denies light-headedness, dizziness.  Patient Active Problem List   Diagnosis Date Noted  . Dizziness 11/27/2016   Past Medical History:  Diagnosis Date  . Anxiety   . Fatigue   . Melanoma ([place])    History  reviewed. No pertinent surgical history. Allergies  Allergen Reactions  . Amoxicillin Hives  . Levaquin [Levofloxacin In D5w] Other (See Comments)    Blurry vision after two doses taken    Prior to Admission medications   Medication Sig Start Date End Date Taking? Authorizing Provider  OVER THE COUNTER MEDICATION D-Ribosome Citrulline Grape Seed Extract Bilberry Tribulus   Yes [provider]  sildenafil (VIAGRA) 100 MG tablet Take 100 mg by mouth daily as needed for erectile dysfunction.   Yes [provider]   Social History   Social History  . Marital status: Divorced    Spouse name: N/A  . Number of children: 4  . Years of education: BS   Occupational History  . Not on file.   Social History Main Topics  . Smoking status: Never Smoker  . Smokeless tobacco: Never Used  . Alcohol use No  . Drug use: No  . Sexual activity: Yes   Other Topics Concern  . Not on file   Social History Narrative   Drinks 1 caffeine drink a day    Review of Systems  Constitutional: Negative for fatigue and fever.  HENT: Positive for sore throat. Negative for dental problem.   Genitourinary: Negative for testicular pain.  Musculoskeletal: Positive for neck pain.  Neurological: Negative for dizziness and light-headedness.  Psychiatric/Behavioral: The patient is not nervous/anxious.    Objective:  Physical Exam  Constitutional: He is oriented to person, place, and time. He appears well-developed and well-nourished. No distress.  HENT:  Head: Normocephalic and atraumatic.  Right Ear: Tympanic membrane, external ear and ear canal normal.  Left Ear: Tympanic membrane, external ear and ear canal normal.  Nose: No rhinorrhea.  Mouth/Throat: Oropharynx is clear and moist and mucous membranes are normal. No dental caries. No oropharyngeal exudate, posterior oropharyngeal edema or posterior oropharyngeal erythema.  Nl appearing throat Few fillings without apparent carries  of decay on lower teeth No surrounding erythema or edema on gums  Eyes: Pupils are equal, round, and reactive to light. Conjunctivae are normal.  Neck: Neck supple.  Cardiovascular: Normal rate, regular rhythm, normal heart sounds and intact distal pulses.  Exam reveals no gallop and no friction rub.   No murmur heard. Pulmonary/Chest: Effort normal and breath sounds normal. No respiratory distress. He has no wheezes. He has no rhonchi. He has no rales.  Abdominal: Soft. There is no tenderness.  Lymphadenopathy:    He has no cervical adenopathy.  Neurological: He is alert and oriented to person, place, and time.  Skin: Skin is warm and dry. No rash noted.  Psychiatric: He has a normal mood and affect. His behavior is normal.  Nursing note and vitals reviewed.   Vitals:   12/20/16 1535  BP: 135/83  Pulse: 68  Resp: 16  Temp: 97.9 F (36.6 C)  TempSrc: Oral  SpO2: 97%  Weight: 203 lb (92.1 kg)  Height: 5' 11"  (1.803 m)   Body mass index is 28.31 kg/m. Assessment & Plan:  25 mins total with face to face counseling.  Kentravious Lipford is a  46 y.o. male Anxiety state - Plan: TSH, T4, Free, T3, Free  -Will repeat TSH with free T3 free T4, but discussed normal readings previously. Would not recommend any supplementation if he has a normal TSH, unless told otherwise by endocrinology.   -Did place a referral to endocrine to discuss plan on any herbal medication/supplement medication including any thyroid supplements, as I am unaware of relative safety of those other medicines   -  Encouraged by improvement in his other symptoms since last visit, but would recommend other SSRI or more time on an SSRI if his symptoms persisted. Option of psychiatry evaluation also discussed.  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 Agreed to repeat CBC  with differential to reassess that level.   Sore throat  - Possibly early viral symptoms. Describes sensation on right jawline but I do not appreciate any concerns or abnormalities on his exam. Also can meet with his dentist to see if there may be a dental issue causing his symptoms.  No orders of the defined types were placed in this encounter.  Patient Instructions   I would generally not recommended the supplements including any thyroid supplements, adrenal supplements, hypothalamus supplements unless specifically low readings. as your last testing was not indicating thyroid testing.   If anxiety, panic symptoms return, would recommend either meeting with psychiatrist for formal testing to make sure we are treating the right condition, or possible trial of other SSRI. Would still recommend keeping up with the therapist.    Based on your prior elevated eosinophils, I will repeat that blood count today, but they were normal on both recent tests here.    I do not see any concerning signs on sore throat today or on your neck exam. Would recommend you discuss the soreness with chewing with your dentist and follow-up if any worsening symptoms.   Sore Throat A sore throat is pain, burning, irritation, or scratchiness in the throat. When you have a sore throat, you may feel pain or tenderness in your throat when you swallow or talk. Many things can cause a sore throat, including:  An infection.  Seasonal allergies.  Dryness in the air.  Irritants, such as smoke or pollution.  Gastroesophageal reflux disease (GERD).  A tumor.  A sore throat is often the first sign of another sickness. It may happen with other symptoms, such as coughing, sneezing, fever, and swollen neck glands. Most sore throats go away without medical treatment. Follow these instructions at home:  Take over-the-counter medicines only as told by your health care provider.  Drink enough fluids to keep your urine clear  or pale yellow.  Rest as needed.  To help with pain, try: ? Sipping warm liquids, such as broth, herbal tea, or warm water. ? Eating or drinking cold or frozen liquids, such as frozen ice pops. ? Gargling with a salt-water mixture 3-4 times a day or as needed. To make a salt-water mixture, completely dissolve -1 tsp of salt in 1 cup of warm water. ? Sucking on hard candy or throat lozenges. ? Putting a cool-mist humidifier in your bedroom at night to moisten the air. ? Sitting in the bathroom with the door closed for 5-10 minutes while you run hot water in the shower.  Do not use any tobacco products, such as cigarettes, chewing tobacco, and e-cigarettes. If you need help quitting,  ask your health care provider. Contact a health care provider if:  You have a fever for more than 2-3 days.  You have symptoms that last (are persistent) for more than 2-3 days.  Your throat does not get better within 7 days.  You have a fever and your symptoms suddenly get worse. Get help right away if:  You have difficulty breathing.  You cannot swallow fluids, soft foods, or your saliva.  You have increased swelling in your throat or neck.  You have persistent nausea and vomiting. This information is not intended to replace advice given to you by your health care provider. Make sure you discuss any questions you have with your health care provider. Document Released: 06/28/2004 Document Revised: 01/15/2016 Document Reviewed: 03/11/2015 Elsevier Interactive Patient Education  2018 [employer].   IF you received an x-Fisher today, you will receive an invoice from [facility] Radiology. Please contact [facility] Radiology at [phone] with questions or concerns regarding your invoice.   IF you received labwork today, you will receive an invoice from [place]. Please contact [facility] at [phone] with questions or concerns regarding your invoice.   Our billing staff will not be able to assist  you with questions regarding bills from these companies.  You will be contacted with the lab results as soon as they are available. The fastest way to get your results is to activate your My Chart account. Instructions are located on the last page of this paperwork. If you have not heard from [place] regarding the results in 2 weeks, please contact this office.        I personally performed the services described in this documentation, which was scribed in my presence. The recorded information has been reviewed and considered for accuracy and completeness, addended by me as needed, and agree with information above.  Signed,   Richard Ray, MD [facility] [place].  12/22/16 4:13 PM

## 2016-12-21 ENCOUNTER — Encounter: Payer: Self-pay | Admitting: Family Medicine

## 2016-12-21 LAB — CBC WITH DIFFERENTIAL/PLATELET
BASOS: 1 %
Basophils Absolute: 0.1 10*3/uL (ref 0.0–0.2)
EOS (ABSOLUTE): 0.5 10*3/uL — ABNORMAL HIGH (ref 0.0–0.4)
EOS: 6 %
HEMATOCRIT: 45.5 % (ref 37.5–51.0)
HEMOGLOBIN: 15.4 g/dL (ref 13.0–17.7)
IMMATURE GRANULOCYTES: 1 %
Immature Grans (Abs): 0.1 10*3/uL (ref 0.0–0.1)
LYMPHS ABS: 2.6 10*3/uL (ref 0.7–3.1)
Lymphs: 32 %
MCH: 30.6 pg (ref 26.6–33.0)
MCHC: 33.8 g/dL (ref 31.5–35.7)
MCV: 91 fL (ref 79–97)
MONOCYTES: 8 %
Monocytes Absolute: 0.7 10*3/uL (ref 0.1–0.9)
NEUTROS PCT: 52 %
Neutrophils Absolute: 4.4 10*3/uL (ref 1.4–7.0)
Platelets: 287 10*3/uL (ref 150–379)
RBC: 5.03 x10E6/uL (ref 4.14–5.80)
RDW: 13.7 % (ref 12.3–15.4)
WBC: 8.2 10*3/uL (ref 3.4–10.8)

## 2016-12-21 LAB — T3, FREE: T3, Free: 3.2 pg/mL (ref 2.0–4.4)

## 2016-12-21 LAB — T4, FREE: Free T4: 1.41 ng/dL (ref 0.82–1.77)

## 2016-12-21 LAB — TSH: TSH: 2.07 u[IU]/mL (ref 0.450–4.500)

## 2016-12-24 ENCOUNTER — Encounter: Payer: Self-pay | Admitting: Family Medicine

## 2016-12-25 ENCOUNTER — Other Ambulatory Visit: Payer: Self-pay | Admitting: Family Medicine

## 2016-12-25 DIAGNOSIS — A6000 Herpesviral infection of urogenital system, unspecified: Secondary | ICD-10-CM

## 2016-12-25 MED ORDER — VALACYCLOVIR HCL 500 MG PO TABS: 500.0000 mg | ORAL_TABLET | Freq: Two times a day (BID) | ORAL | 0 refills | Status: DC

## 2016-12-29 ENCOUNTER — Encounter: Payer: Self-pay | Admitting: Family Medicine

## 2016-12-30 DIAGNOSIS — R1310 Dysphagia, unspecified: Secondary | ICD-10-CM | POA: Diagnosis not present

## 2016-12-30 DIAGNOSIS — R1319 Other dysphagia: Secondary | ICD-10-CM | POA: Diagnosis not present

## 2016-12-30 DIAGNOSIS — M542 Cervicalgia: Secondary | ICD-10-CM | POA: Diagnosis not present

## 2016-12-30 DIAGNOSIS — R070 Pain in throat: Secondary | ICD-10-CM | POA: Diagnosis not present

## 2017-01-25 ENCOUNTER — Telehealth: Payer: BLUE CROSS/BLUE SHIELD | Admitting: Family

## 2017-01-25 DIAGNOSIS — R300 Dysuria: Secondary | ICD-10-CM

## 2017-01-25 DIAGNOSIS — R109 Unspecified abdominal pain: Secondary | ICD-10-CM | POA: Diagnosis not present

## 2017-01-25 DIAGNOSIS — R309 Painful micturition, unspecified: Secondary | ICD-10-CM | POA: Diagnosis not present

## 2017-01-25 NOTE — Progress Notes (Signed)
Thank you for the details you put in the comment boxes. Those details really help [place] take better care of you. I am very concerned about the details you posted and this will require face-to-face.  We are sorry that you are not feeling well.  Here is how we plan to help!  Male bladder infections are not very common.  We worry about prostate or kidney conditions.  The standard of care is to examine the abdomen and kidneys, and to do a urine and blood test to make sure that something more serious is not going on.  We recommend that you see a provider today.  If your doctor's office is closed [place] has the following Urgent Cares:  If you need care fast and have a high deductible or no insurance consider:   [email or web address]  [phone]  [address] [place], [place] [address] 8 am to 8 pm Monday-Friday 10 am to 4 pm Saturday-Sunday   The following sites will take your  insurance:    . [facility] Urgent [place] a Provider at this Location  [address] [place], [place] [address] . 10 am to 8 pm Monday-Friday . 12 pm to 8 pm Saturday-Sunday   . [facility] Urgent [facility] at [place] a Provider at this Location  [address], [place], [place] [address] . 8 am to 8 pm Monday-Friday . 9 am to 6 pm Saturday . 11 am to 6 pm Sunday   . [facility]gent [facility] at [place] Get Driving Directions  [address], [place] [address] . 8 am to 8 pm Monday-Friday . 8 am to 4 pm Saturday-Sunday   . [facility] (a walk in primary care provider)  [place] a Provider at this Location  [place], [place] [address] . 8 am to 8:30 pm Monday-Thursday . 8 am to 6 pm Friday . 8 am to 4 pm Saturday-Sunday   Your e-visit answers were  reviewed by a board certified advanced clinical practitioner to complete your personal care plan.  Thank you for using e-Visits.

## 2017-02-07 ENCOUNTER — Ambulatory Visit (INDEPENDENT_AMBULATORY_CARE_PROVIDER_SITE_OTHER): Payer: BLUE CROSS/BLUE SHIELD | Admitting: Family Medicine

## 2017-02-07 ENCOUNTER — Encounter: Payer: Self-pay | Admitting: Family Medicine

## 2017-02-07 VITALS — BP 121/84 | HR 69 | Temp 97.7°F | Resp 16 | Ht 71.0 in | Wt 217.0 lb

## 2017-02-07 DIAGNOSIS — R5383 Other fatigue: Secondary | ICD-10-CM

## 2017-02-07 DIAGNOSIS — R319 Hematuria, unspecified: Secondary | ICD-10-CM | POA: Diagnosis not present

## 2017-02-07 DIAGNOSIS — R6884 Jaw pain: Secondary | ICD-10-CM | POA: Diagnosis not present

## 2017-02-07 DIAGNOSIS — G479 Sleep disorder, unspecified: Secondary | ICD-10-CM | POA: Diagnosis not present

## 2017-02-07 LAB — POCT URINALYSIS DIP (MANUAL ENTRY)
BILIRUBIN UA: NEGATIVE
Glucose, UA: NEGATIVE mg/dL
Ketones, POC UA: NEGATIVE mg/dL
Leukocytes, UA: NEGATIVE
Nitrite, UA: NEGATIVE
Protein Ur, POC: NEGATIVE mg/dL
UROBILINOGEN UA: 0.2 U/dL
pH, UA: 6.5 (ref 5.0–8.0)

## 2017-02-07 NOTE — Patient Instructions (Addendum)
I would like to talk to your sleep specialist about plan for your naps in the morning and whether a short term medicine to promote longer interval of sleep at night, to lessen need for naps during the day.  For now, try to avoid the alarm every 30 minutes when you return to sleep for morning nap. Try to limit morning nap to no more than an hour to an hour and a half though. I will check into other recommendations.   For your jaw pain, I would like you to meet with ENT.  I will place a referral. Try to get a copy of your CT scan of the neck that was performed as well as the letter from your dentist to bring to that office visit.   Computer testing for blood in urine picked up possible trace blood today. I'll like to recheck that in the next 2 weeks to see if still present and check a urine test under the microscope at that time. If still persistent blood, can perform possible CT scan to look for kidney stones or urology evaluation.  If any return of urinary symptoms, visible blood in urine, or persistent discomfort with urination return for further sooner.   Hematuria, Adult Hematuria is blood in your urine. It can be caused by a bladder infection, kidney infection, prostate infection, kidney stone, or cancer of your urinary tract. Infections can usually be treated with medicine, and a kidney stone usually will pass through your urine. If neither of these is the cause of your hematuria, further workup to find out the reason may be needed. It is very important that you tell your health care provider about any blood you see in your urine, even if the blood stops without treatment or happens without causing pain. Blood in your urine that happens and then stops and then happens again can be a symptom of a very serious condition. Also, pain is not a symptom in the initial stages of many urinary cancers. Follow these instructions at home:  Drink lots of fluid, 3-4 quarts a day. If you have been diagnosed with  an infection, cranberry juice is especially recommended, in addition to large amounts of water.  Avoid caffeine, tea, and carbonated beverages because they tend to irritate the bladder.  Avoid alcohol because it may irritate the prostate.  Take all medicines as directed by your health care provider.  If you were prescribed an antibiotic medicine, finish it all even if you start to feel better.  If you have been diagnosed with a kidney stone, follow your health care provider's instructions regarding straining your urine to catch the stone.  Empty your bladder often. Avoid holding urine for long periods of time.  After a bowel movement, women should cleanse front to back. Use each tissue only once.  Empty your bladder before and after sexual intercourse if you are a male. Contact a health care provider if:  You develop back pain.  You have a fever.  You have a feeling of sickness in your stomach (nausea) or vomiting.  Your symptoms are not better in 3 days. Return sooner if you are getting worse. Get help right away if:  You develop severe vomiting and are unable to keep the medicine down.  You develop severe back or abdominal pain despite taking your medicines.  You begin passing a large amount of blood or clots in your urine.  You feel extremely weak or faint, or you pass out. This information is not  intended to replace advice given to you by your health care provider. Make sure you discuss any questions you have with your health care provider. Document Released: 05/21/2005 Document Revised: 10/27/2015 Document Reviewed: 01/19/2013 Elsevier Interactive Patient Education  2017 [employer].    IF you received an x-ray today, you will receive an invoice from [facility] Radiology. Please contact [facility] Radiology at [phone] with questions or concerns regarding your invoice.   IF you received labwork today, you will receive an invoice from [place]. Please contact  [facility] at [phone] with questions or concerns regarding your invoice.   Our billing staff will not be able to assist you with questions regarding bills from these companies.  You will be contacted with the lab results as soon as they are available. The fastest way to get your results is to activate your My Chart account. Instructions are located on the last page of this paperwork. If you have not heard from [place] regarding the results in 2 weeks, please contact this office.

## 2017-02-07 NOTE — Progress Notes (Signed)
Subjective:  By signing my name below, I, Richard Fisher, attest that this documentation has been prepared under the direction and in the presence of Richard Ray, MD. Electronically Signed: [name], [place]. 02/07/2017 , 6:49 PM .  Patient was seen in Room 12 .   Patient ID: Richard Fisher, male    DOB: Jan 16, 1971, 46 y.o.   MRN: [id number] Chief Complaint  Patient presents with  . Fatigue    multiple naps per day   . Jaw Pain    follow up from prev visit    HPI Richard Fisher is a 46 y.o. male Follow up on fatigue and jaw pain.   Fatigue See last office visit on July 19th. He's had multiple recent symptoms and evaluations for fatigue, lightheadedness and blurry vision. He was seen in the ER for some of these symptoms with reassuring chest xray, CMP, troponin, CBC, EKG, and CT of head without signs of acute hemorrhage or infarction. He did have a non specific EKG, and had been referred to cardiologist for near syncope and was started on Zoloft 25m QD for anxiety/depression. He was seen by neurology/sleep specialist on July 12th. His sleep study and day time nap study did not support narcolepsy or hypersomnolence. He stopped Zoloft and started taking thyroid medication. He had had multiple Fisher tests done outside of office, see MyChart emails regarding those details.   He [name] and was started on Lauricidin for apparently elevated eosinophils and treated for desiccated hypothalamus, adrenals, thyroid and 4 other supplements. His TSH was normal at 1.85 and then June rechecked at 2. He did report at last visit that it was 3.5. He stated his fatigue, anxiety and testicular discomfort had all improved since starting supplements. His blurry vision was [facility] age [facility] visit on June 26th, EKG appeared normal; did not suspect CV etiology. No further testing, and recheck in 6 months.  He was  recommended to discuss those supplements with endocrinology; referral was placed. He had a normal thyroid panel including T3, T4 and TSH on July 19th. His CBC was also overall reassuring. Eosinophil was 0.5, and normal was 0 to 0.4. Endocrinology scheduled for Sept 13th.   Patient states he still feels fatigued. He would go to sleep at around 10:00PM, and wakes up at around 3:00-5:00AM. He would be able to stay awake until 8:00-9:00AM, and then back to bed intermittently every 30 minutes, until around 12:00-1:00PM. He is mostly fine from this time until 10:00PM. He's tried melatonin on occasion to facilitate the sleep, but no correlation to staying asleep. During the week, he would set the 338m interval alarms. But during the weekends, he tends to sleep through until waking up naturally. He denies dizziness.   Modafinil wasn't recommended based on studies. He only took Zoloft for a day. He denies feeling depressed or anxious recently.   Jaw pain with associated sore throat Complained of pain on right side of jaw for past few months. [place] of atrophic submandibular glands and dry mouth. Based on his email on Aug 31st, he had been seen in the ER after dental visit with reported CT neck with contrast that was reportedly normal. His left sided swallowing pain subsided over 2 weeks, but he still had discomfort with chewing on the right side.   Patient states the discomfort with chewing on the right side is still present,  and also noticed with burping. He had self paid Sjorgren's testing done, and reportedly negative. The dentist denied caused by dental issues.   Urinary symptoms See e-visit. He did not have evaluation until end. Patient reports pain with urination about 2 weeks ago. He went to an Urgent care and was believed he likely passed the kidney stone as no more pain with urination.   Patient Active Problem List   Diagnosis Date Noted  .  Dizziness 11/27/2016   Past Medical History:  Diagnosis Date  . Anxiety   . Fatigue   . Melanoma ([place])    History reviewed. No pertinent surgical history. Allergies  Allergen Reactions  . Amoxicillin Hives  . Levaquin [Levofloxacin In D5w] Other (See Comments)    Blurry vision after two doses taken    Prior to Admission medications   Medication Sig Start Date End Date Taking? Authorizing Provider  OVER THE COUNTER MEDICATION D-Ribosome Citrulline Grape Seed Extract Bilberry Tribulus    [provider]  sildenafil (VIAGRA) 100 MG tablet Take 100 mg by mouth daily as needed for erectile dysfunction.    [provider]  valACYclovir (VALTREX) 500 MG tablet Take 1 tablet (500 mg total) by mouth 2 (two) times daily. For 3 days. Start at first sign of recurrence. 12/25/16   [name], MD   Social History   Social History  . Marital status: Divorced    Spouse name: N/A  . Number of children: 4  . Years of education: BS   Occupational History  . Not on file.   Social History Main Topics  . Smoking status: Never Smoker  . Smokeless tobacco: Never Used  . Alcohol use No  . Drug use: No  . Sexual activity: Yes   Other Topics Concern  . Not on file   Social History Narrative   Drinks 1 caffeine drink a day    Review of Systems  Constitutional: Positive for fatigue. Negative for unexpected weight change.  HENT:       Jaw pain  Eyes: Negative for visual disturbance.  Respiratory: Negative for cough, chest tightness and shortness of breath.   Cardiovascular: Negative for chest pain, palpitations and leg swelling.  Gastrointestinal: Negative for abdominal pain and Fisher in stool.  Genitourinary: Negative for dysuria.  Neurological: Negative for dizziness, light-headedness and headaches.  Psychiatric/Behavioral: Positive for sleep disturbance. The patient is not nervous/anxious.        Objective:   Physical Exam  Constitutional: He is oriented  to person, place, and time. He appears well-developed and well-nourished.  HENT:  Head: Normocephalic and atraumatic.  TMJ non tender although slight clicking on the right side; no enlarged lymph nodes or any apparent differences in left or right submandibular glands, no palpable tenderness in right jaw  Eyes: Pupils are equal, round, and reactive to light. EOM are normal.  Neck: No JVD present. Carotid bruit is not present.  Cardiovascular: Normal rate, regular rhythm and normal heart sounds.   No murmur heard. Pulmonary/Chest: Effort normal and breath sounds normal. He has no rales.  Musculoskeletal: He exhibits no edema.  Neurological: He is alert and oriented to person, place, and time.  Skin: Skin is warm and dry.  Psychiatric: He has a normal mood and affect.  Vitals reviewed.   Vitals:   02/07/17 1723  BP: 121/84  Pulse: 69  Resp: 16  Temp: 97.7 F (36.5 C)  TempSrc: Oral  SpO2: 97%  Weight: 217 lb (98.4 kg)  Height: 5' 11"  (1.803 m)   Results for orders placed or performed in visit on 02/07/17  POCT urinalysis dipstick  Result Value Ref Range   Color, UA yellow yellow   Clarity, UA clear clear   Glucose, UA negative negative mg/dL   Bilirubin, UA negative negative   Ketones, POC UA negative negative mg/dL   Spec Grav, UA <=1.005 (A) 1.010 - 1.025   Fisher, UA trace-lysed (A) negative   pH, UA 6.5 5.0 - 8.0   Protein Ur, POC negative negative mg/dL   Urobilinogen, UA 0.2 0.2 or 1.0 E.U./dL   Nitrite, UA Negative Negative   Leukocytes, UA Negative Negative       Assessment & Plan:    Zuri Bradway is a 46 y.o. male Hematuria, unspecified type - Plan: POCT urinalysis dipstick, CANCELED: POCT Microscopic Urinalysis ([facility])  - Trace Fisher on urine dipstick in office. Asymptomatic. Prior symptoms could have been due to kidney stone versus less likely infection. Recheck urinalysis with micro-in 2 weeks and if still persistent, consider CT urogram to eval for stone  burden versus urology appointment  Fatigue, unspecified type Sleeping difficulties  -Fatigue is likely due to his irregular sleep pattern with multiple awakenings during the morning. Reviewed notes and discussion from sleep specialist eval.  -Initially would recommend trying to increase initial sleep time, but if he does nap in the morning, recommend against setting alarm for every 30 minutes as that may not be sufficient time. Would recommend against taking naps more than hour to hour and a half. We'll send a message to sleep specialist to see if other guidance recommended.  Jaw pain - Plan: Ambulatory referral to ENT  - Slight clicking on right, differential includes radiating pain from TMJ syndrome, but appears to be lower on jaw line. Reportedly had negative CT of neck, which may have a therapist that area as well. Recommend obtaining that study and follow-up with ENT to discuss next step. Also recommended bringing letter from his dentist to ENT visit to discuss possible small mandibular glands that were noted.   No orders of the defined types were placed in this encounter.  Patient Instructions   I would like to talk to your sleep specialist about plan for your naps in the morning and whether a short term medicine to promote longer interval of sleep at night, to lessen need for naps during the day.  For now, try to avoid the alarm every 30 minutes when you return to sleep for morning nap. Try to limit morning nap to no more than an hour to an hour and a half though. I will check into other recommendations.   For your jaw pain, I would like you to meet with ENT.  I will place a referral. Try to get a copy of your CT scan of the neck that was performed as well as the letter from your dentist to bring to that office visit.   Computer testing for Fisher in urine picked up possible trace Fisher today. I'll like to recheck that in the next 2 weeks to see if still present and check a urine test under  the microscope at that time. If still persistent Fisher, can perform possible CT scan to look for kidney stones or urology evaluation.  If any return of urinary symptoms, visible Fisher in urine, or persistent discomfort with urination return for further sooner.   Hematuria, Adult Hematuria is Fisher in your urine. It can be caused by a bladder infection,  kidney infection, prostate infection, kidney stone, or cancer of your urinary tract. Infections can usually be treated with medicine, and a kidney stone usually will pass through your urine. If neither of these is the cause of your hematuria, further workup to find out the reason may be needed. It is very important that you tell your health care provider about any Fisher you see in your urine, even if the Fisher stops without treatment or happens without causing pain. Fisher in your urine that happens and then stops and then happens again can be a symptom of a very serious condition. Also, pain is not a symptom in the initial stages of many urinary cancers. Follow these instructions at home:  Drink lots of fluid, 3-4 quarts a day. If you have been diagnosed with an infection, cranberry juice is especially recommended, in addition to large amounts of water.  Avoid caffeine, tea, and carbonated beverages because they tend to irritate the bladder.  Avoid alcohol because it may irritate the prostate.  Take all medicines as directed by your health care provider.  If you were prescribed an antibiotic medicine, finish it all even if you start to feel better.  If you have been diagnosed with a kidney stone, follow your health care provider's instructions regarding straining your urine to catch the stone.  Empty your bladder often. Avoid holding urine for long periods of time.  After a bowel movement, women should cleanse front to back. Use each tissue only once.  Empty your bladder before and after sexual intercourse if you are a male. Contact a  health care provider if:  You develop back pain.  You have a fever.  You have a feeling of sickness in your stomach (nausea) or vomiting.  Your symptoms are not better in 3 days. Return sooner if you are getting worse. Get help right away if:  You develop severe vomiting and are unable to keep the medicine down.  You develop severe back or abdominal pain despite taking your medicines.  You begin passing a large amount of Fisher or clots in your urine.  You feel extremely weak or faint, or you pass out. This information is not intended to replace advice given to you by your health care provider. Make sure you discuss any questions you have with your health care provider. Document Released: 05/21/2005 Document Revised: 10/27/2015 Document Reviewed: 01/19/2013 Elsevier Interactive Patient Education  2017 [employer].    IF you received an x-Fisher today, you will receive an invoice from [facility]. Please contact [facility] at [phone] with questions or concerns regarding your invoice.   IF you received labwork today, you will receive an invoice from [facility]. Please contact [facility] at [phone] with questions or concerns regarding your invoice.   Our billing staff will not be able to assist you with questions regarding bills from these companies.  You will be contacted with the lab results as soon as they are available. The fastest way to get your results is to activate your My Chart account. Instructions are located on the last page of this paperwork. If you have not heard from [place] regarding the results in 2 weeks, please contact this office.       I personally performed the services described in this documentation, which was scribed in my presence. The recorded information has been reviewed and considered for accuracy and completeness, addended by me as needed, and agree with information above.  Signed,   Richard Ray, MD [facility].  02/10/17 1:57 PM

## 2017-02-12 DIAGNOSIS — D225 Melanocytic nevi of trunk: Secondary | ICD-10-CM | POA: Diagnosis not present

## 2017-02-12 DIAGNOSIS — L84 Corns and callosities: Secondary | ICD-10-CM | POA: Diagnosis not present

## 2017-02-12 DIAGNOSIS — B353 Tinea pedis: Secondary | ICD-10-CM | POA: Diagnosis not present

## 2017-02-12 DIAGNOSIS — Z85820 Personal history of malignant melanoma of skin: Secondary | ICD-10-CM | POA: Diagnosis not present

## 2017-02-14 ENCOUNTER — Ambulatory Visit (INDEPENDENT_AMBULATORY_CARE_PROVIDER_SITE_OTHER): Payer: BLUE CROSS/BLUE SHIELD | Admitting: Endocrinology

## 2017-02-14 ENCOUNTER — Encounter: Payer: Self-pay | Admitting: Endocrinology

## 2017-02-14 VITALS — BP 142/88 | HR 64 | Ht 71.0 in | Wt 221.6 lb

## 2017-02-14 DIAGNOSIS — R400 Somnolence: Secondary | ICD-10-CM

## 2017-02-14 DIAGNOSIS — R5383 Other fatigue: Secondary | ICD-10-CM

## 2017-02-14 NOTE — Progress Notes (Signed)
Patient ID: Richard Fisher, male   DOB: 14-Nov-1970, 46 y.o.   MRN: [id number]           Referring Physician: Cindee Lame  Reason for Appointment:  Endocrine evaluation   History of Present Illness:    Chief complaint: Feeling sleepy  Patient has had problems with excessive daytime sleepiness since his late teens He generally would be able to sleep well at night but need to sleep during the day He usually is able to wake up feeling refreshed in the mornings but would need to sleep for 2-3 hours late morning and again sometimes in the afternoon Sometimes he would sleep all day. He says he would feel lethargic when he would get sleepy and cannot function without sleeping Generally the patient does not feel tired unless he has not had his nap  On his own for several years the patient treated himself with MODAFINIL that he was able to get online He said that would significantly improve his daytime alertness and he would not need to sleep With this also he would feel less tired and lethargic However for the last few days not be able to get this because of the expense and has not been able to get a prescription from his physicians for this  He does complain of cold intolerance which is chronic. His weight has fluctuated based on his diet over the last year and recently gaining weight because of eating more meat and portions  His thyroid levels have been consistently normal as seen on records since 12/17 Patient apparently was evaluated by a urologist in July and was told that he needed to be on  desiccated thyroid supplement because his TSH was 3.8, normal <4.5  On his own he has had salivary cortisol tests and he was told that these were low throughout the day Testosterone level has been about 600+ and his free direct testosterone was over 50  He was also told to take "adrenal and hypothalamic supplements" He took these supplements for about 2 months until about 2 weeks ago and stopped  them only because of having kidney stone  He thinks he was feeling somewhat better with taking these but also at the same time had improved his diet and was eating better He also still has difficulty with needing to sleep for 2-3 hours or more during the day  Patient's weight history is as follows:  Wt Readings from Last 3 Encounters:  02/14/17 221 lb 9.6 oz (100.5 kg)  02/07/17 217 lb (98.4 kg)  12/20/16 203 lb (92.1 kg)    Thyroid function results have been as follows:  Lab Results  Component Value Date   TSH 2.070 12/20/2016   TSH 2.070 11/05/2016   TSH 1.85 05/04/2016   FREET4 1.41 12/20/2016   T3FREE 3.2 12/20/2016     Past Medical History:  Diagnosis Date  . Anxiety   . Fatigue   . Melanoma ([place])     No past surgical history on file.  No family history on file.  Social History:  reports that he has never smoked. He has never used smokeless tobacco. He reports that he does not drink alcohol or use drugs.  Allergies:  Allergies  Allergen Reactions  . Amoxicillin Hives  . Levaquin [Levofloxacin In D5w] Other (See Comments)    Blurry vision after two doses taken     Allergies as of 02/14/2017      Reactions   Amoxicillin Hives   Levaquin [levofloxacin In D5w]  Other (See Comments)   Blurry vision after two doses taken       Medication List       Accurate as of 02/14/17 12:30 PM. Always use your most recent med list.          OVER THE COUNTER MEDICATION D-Ribosome Citrulline Grape Seed Extract Bilberry Tribulus   sildenafil 100 MG tablet Commonly known as:  VIAGRA Take 100 mg by mouth daily as needed for erectile dysfunction.          Review of Systems  Constitutional: Positive for weight gain.  HENT: Positive for trouble swallowing.   Eyes: Positive for blurred vision.  Respiratory: Positive for daytime sleepiness. Negative for shortness of breath.   Cardiovascular: Negative for chest pain.  Gastrointestinal: Negative for nausea.    Endocrine: Positive for fatigue and cold intolerance.       Naps  Musculoskeletal: Negative for joint pain.  Skin: Negative for abnormal pigmentation.       He has a previous history of melanoma successively removed  Neurological: Negative for weakness.  Psychiatric/Behavioral: Positive for insomnia.Negative for depressed mood.       More recently has had some late insomnia                Examination:    BP (!) 142/88   Pulse 64   Ht 5' 11"  (1.803 m)   Wt 221 lb 9.6 oz (100.5 kg)   SpO2 98%   BMI 30.91 kg/m   Standing blood pressure 130/90  GENERAL:  Large build.  Well nourished.  No cushingoid features  No pallor, clubbing, lymphadenopathy or edema.    Skin:  no rash or pigmentation.  EYES:  No prominence or swelling of the eyes   ENT: Oral mucosa and tongue normal.  No oral pigmentation.  Oropharyngeal opening is normal Bilateral submandibular glands palpable, nontender  THYROID:  Not palpable.  HEART:  Normal  S1 and S2; no murmur or click.  CHEST:    Lungs: Vescicular breath sounds heard equally.   No crepitations/ wheeze.  ABDOMEN:  No distention.  Liver and spleen not palpable.  No other mass or tenderness.  NEUROLOGICAL: Reflexes are slightly brisk bilaterally at biceps and normal at ankles.  JOINTS:  Normal peripheral joints.   Assessment:  ?  NARCOLEPSY.  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 along with a possible family History also He also had significant improvement in symptoms when taking Provigil on his own in the past  Explained to patient that an isolated high normal TSH does not indicate that he has hypothyroidism and has symptoms of somnolence and cold intolerance are significantly out of proportion to his thyroid levels  Also explained to patient that he has no symptoms or signs of adrenal insufficiency and that his salivary cortisol testing elsewhere was probably inaccurate, especially with his abnormal sleep  cycles  PLAN:   Since the patient has stopped taking thyroid supplements only about 2 weeks ago will wait another month to recheck his thyroid levels including free T4 and free T3  Follow-up necessary only if thyroid levels are abnormal  Strongly recommend that he be see by a sleep disorder specialist/pulmonologist for management   [facility] 02/14/2017, 12:30 PM   Consultation note copy sent to the PCP  Note: This office note was prepared with Dragon voice recognition system technology. Any transcriptional errors that result from this process are unintentional.

## 2017-02-17 ENCOUNTER — Encounter: Payer: Self-pay | Admitting: Family Medicine

## 2017-02-17 DIAGNOSIS — G479 Sleep disorder, unspecified: Secondary | ICD-10-CM

## 2017-02-19 NOTE — Telephone Encounter (Signed)
See previous Mychart messages. Sleep difficulty, requesting second opinion from other sleep medicine provider. We'll refer to Alroy Bailiff, [facility] or [facility].

## 2017-02-21 ENCOUNTER — Ambulatory Visit: Payer: Self-pay | Admitting: Family Medicine

## 2017-02-27 ENCOUNTER — Ambulatory Visit (INDEPENDENT_AMBULATORY_CARE_PROVIDER_SITE_OTHER): Payer: BLUE CROSS/BLUE SHIELD | Admitting: Family Medicine

## 2017-02-27 ENCOUNTER — Encounter: Payer: Self-pay | Admitting: Family Medicine

## 2017-02-27 VITALS — BP 126/78 | HR 73 | Temp 98.9°F | Resp 16 | Ht 71.0 in | Wt 222.8 lb

## 2017-02-27 DIAGNOSIS — C439 Malignant melanoma of skin, unspecified: Secondary | ICD-10-CM | POA: Diagnosis not present

## 2017-02-27 DIAGNOSIS — R3129 Other microscopic hematuria: Secondary | ICD-10-CM | POA: Diagnosis not present

## 2017-02-27 DIAGNOSIS — R400 Somnolence: Secondary | ICD-10-CM | POA: Diagnosis not present

## 2017-02-27 HISTORY — DX: Malignant melanoma of skin, unspecified: C43.9

## 2017-02-27 NOTE — Progress Notes (Addendum)
Patient ID: Richard Fisher, male    DOB: [birth year], 46 y.o.   MRN: [id number]  Chief Complaint  Patient presents with  . Hematuria    passed a kidney stone, has had trace blood in recent UAs  . Fatigue    modafinal (spelling?) self medicating    Allergies Amoxicillin and Levaquin [levofloxacin in d5w]  Subjective:   Richard Fisher is a 46 y.o. male who presents to [facility] today.  HPI Reports that was previously seen by Dr. Carlota Raspberry for PCP but lives in this area and wants to change to our office b/c can walk here and does not drive. Reports that does not own a car and has a philosophy that he does not like to own material possessions. Reports is here to follow up on some medical issues.  Was told that 3 weeks ago had kidney stone. Woke up and had dysuria, was hurting to urinate and felt like "peeing razorblades". Went to the urgent care and had blood in urine and told that had probably had a kidney stone. Then went to PCP and recheck urine and still had RBC. Wants to follow up and make sure that it is clear.   Reports that has had fatigue for years. Reports that even from his teen years has been tired and sleeps/naps during the day. Takes two naps a day for 3 hours each at least. Works from home. Reports that it has not previously disturbed his job b/c had plenty of money and did not have to work much. 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 Was recently seen by sleep medicine and told that did not meet criteria to take provigil or other medication for ES. Has been checked for sleep apnea.  Has had thyroid checked and seen by endocrine currently. Does not have thyroid problem but gets it followed. Has used herbal thyroid products as prescribed by doctor at [facility].  Reports that sees a cardiologist and told that heart was perfect.  Reports has been seeing a psychologist and told might be dysthymic. Reports that everything in body and life was perfect until hit  his mid-forty age and now it is all downhill. Has had to get glasses, gained some weight. Works out everyday at [employer]. Sleeps well. Reports does not snore. Does not feel sad but can get anxious at times.   Reports that not to long ago had about $10,000 of labs done to check everything out and it was all normal except his EBV and CMV titers were high. Reports that all in his family sleep to much so must be genetic.   Sees psychologist    Hematuria  This is a recurrent problem. The current episode started 1 to 4 weeks ago. The problem is unchanged. He describes the hematuria as microscopic hematuria. He reports no clotting in his urine stream. His pain is at a severity of 0/10. He is experiencing no pain. He describes his urine color as clear. Irritative symptoms do not include frequency, nocturia or urgency. Obstructive symptoms do not include dribbling, incomplete emptying, an intermittent stream, a slower stream, straining or a weak stream. Associated symptoms include inability to urinate. Pertinent negatives include no abdominal pain, chills, dysuria, facial swelling, fever, flank pain, genital pain, hesitancy, nausea or vomiting. He is sexually active. His past medical history is significant for kidney stones. There is no history of BPH, GU trauma, hypertension, prostatitis, recent infection or tobacco use.    Past  Medical History:  Diagnosis Date  . Anxiety   . Fatigue   . Melanoma ([place])    stage 0 melanoma 05/2015    Past Surgical History:  Procedure Laterality Date  . MELANOMA EXCISION     followed by Dr. Magnus Sinning in [facility]    Family History  Problem Relation Age of Onset  . Cancer Sister   . Cholecystitis Brother   . Thyroid disease Neg Hx        His mother had transient hypothyroidism     Social History   Social History  . Marital status: Divorced    Spouse name: N/A  . Number of children: 4  . Years of education: BS   Social History Main Topics    . Smoking status: Never Smoker  . Smokeless tobacco: Never Used  . Alcohol use No  . Drug use: No  . Sexual activity: Yes   Other Topics Concern  . None   Social History Narrative   Drinks 1 caffeine drink a day. Getting married next year. Moved here 3 years ago to be near the mother of daughter. Works as Occupational psychologist.    Grew up in [place].     Review of Systems  Constitutional: Positive for fatigue. Negative for activity change, appetite change, chills, fever and unexpected weight change.  HENT: Negative for facial swelling, sinus pressure, sneezing and trouble swallowing.   Eyes: Negative for visual disturbance.  Respiratory: Negative for apnea, cough, chest tightness and shortness of breath.   Cardiovascular: Negative for chest pain, palpitations and leg swelling.  Gastrointestinal: Negative for abdominal pain, blood in stool, constipation, diarrhea, nausea and vomiting.  Endocrine: Negative for cold intolerance, heat intolerance, polydipsia, polyphagia and polyuria.  Genitourinary: Positive for hematuria. Negative for decreased urine volume, discharge, dysuria, flank pain, frequency, genital sores, hesitancy, incomplete emptying, nocturia, penile pain, penile swelling, scrotal swelling, testicular pain and urgency.  Musculoskeletal: Negative for arthralgias, joint swelling, neck pain and neck stiffness.  Allergic/Immunologic: Negative for environmental allergies and food allergies.  Neurological: Negative for dizziness, tremors, seizures, syncope, weakness, light-headedness, numbness and headaches.  Hematological: Negative for adenopathy. Does not bruise/bleed easily.  Psychiatric/Behavioral: Negative for behavioral problems, confusion, decreased concentration, dysphoric mood, self-injury, sleep disturbance and suicidal ideas. The patient is not hyperactive.      Objective:   BP 126/78 (BP Location: Left Arm, Patient Position: Sitting, Cuff Size: Normal)   Pulse 73   Temp  98.9 F (37.2 C) (Other (Comment))   Resp 16   Ht 5' 11"  (1.803 m)   Wt 222 lb 12 oz (101 kg)   SpO2 96%   BMI 31.07 kg/m   Physical Exam  Constitutional: He is oriented to person, place, and time. He appears well-developed and well-nourished.  HENT:  Head: Normocephalic and atraumatic.  Eyes: Pupils are equal, round, and reactive to light. Conjunctivae and EOM are normal.  Neck: Normal range of motion. Neck supple. No JVD present. No tracheal deviation present. No thyromegaly present.  Cardiovascular: Normal rate, regular rhythm and normal heart sounds.   Pulses:      Dorsalis pedis pulses are 2+ on the right side, and 2+ on the left side.  Pulmonary/Chest: Effort normal and breath sounds normal. He has no wheezes.  Musculoskeletal: Normal range of motion. He exhibits no edema.  Lymphadenopathy:    He has no cervical adenopathy.  Neurological: He is alert and oriented to person, place, and time. No cranial nerve deficit. Coordination normal.  Skin:  Skin is warm, dry and intact. No rash noted.  Psychiatric: He has a normal mood and affect. His behavior is normal. Thought content normal.  Vitals reviewed.    Assessment and Plan   1. Hematuria, microscopic Check urine. Labs in computer reviewed. Will either recheck urine in one month if normal or if hematuria present will refer to urology.  - Urinalysis, microscopic only - Urinalysis, Routine w reflex microscopic  2. Melanoma of skin ([place]) Keep appointments as scheduled with dermatology  3. Daytime somnolence Request records from neurology/sleep medicine for review.  4. 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 Consider wellbutrin or effexor b/c could help with alertness/somnolence. However keep appts with psychologist at this time and have office notes sent for review.  Patient to send in battery of labs that he recently had done.     Urine test came back normal. Will recheck in one month. Patient informed and he will return to the lab.    Return in about 3 months (around 05/29/2017). Caren Macadam, MD 02/27/2017

## 2017-02-28 ENCOUNTER — Encounter: Payer: Self-pay | Admitting: Family Medicine

## 2017-02-28 LAB — URINALYSIS, COMPLETE
BILIRUBIN URINE: NEGATIVE
Bacteria, UA: NONE SEEN /HPF
GLUCOSE, UA: NEGATIVE
Hgb urine dipstick: NEGATIVE
Hyaline Cast: NONE SEEN /LPF
KETONES UR: NEGATIVE
LEUKOCYTES UA: NEGATIVE
NITRITE: NEGATIVE
PH: 6 (ref 5.0–8.0)
Protein, ur: NEGATIVE
RBC / HPF: NONE SEEN /HPF (ref 0–2)
SPECIFIC GRAVITY, URINE: 1.02 (ref 1.001–1.03)
Squamous Epithelial / LPF: NONE SEEN /HPF (ref <=5)
WBC, UA: NONE SEEN /HPF (ref 0–5)

## 2017-02-28 NOTE — Addendum Note (Signed)
Addended by: Caren Macadam on: 02/28/2017 11:17 AM   Modules accepted: Orders

## 2017-03-25 ENCOUNTER — Ambulatory Visit (INDEPENDENT_AMBULATORY_CARE_PROVIDER_SITE_OTHER): Payer: BLUE CROSS/BLUE SHIELD | Admitting: Otolaryngology

## 2017-03-25 DIAGNOSIS — R070 Pain in throat: Secondary | ICD-10-CM | POA: Diagnosis not present

## 2017-04-01 ENCOUNTER — Telehealth: Payer: Self-pay | Admitting: Family Medicine

## 2017-04-01 NOTE — Telephone Encounter (Signed)
Called patient to r/s appt for 04/10/17 due to a cancellation list. No answer, no voicemail.

## 2017-04-10 ENCOUNTER — Ambulatory Visit: Payer: Self-pay | Admitting: Family Medicine

## 2017-04-11 ENCOUNTER — Encounter: Payer: Self-pay | Admitting: Family Medicine

## 2017-04-11 ENCOUNTER — Telehealth: Payer: Self-pay | Admitting: Neurology

## 2017-04-11 ENCOUNTER — Other Ambulatory Visit: Payer: Self-pay

## 2017-04-11 ENCOUNTER — Ambulatory Visit: Payer: BLUE CROSS/BLUE SHIELD | Admitting: Family Medicine

## 2017-04-11 VITALS — BP 130/74 | HR 96 | Temp 97.4°F | Resp 16 | Ht 71.0 in | Wt 228.8 lb

## 2017-04-11 DIAGNOSIS — R319 Hematuria, unspecified: Secondary | ICD-10-CM

## 2017-04-11 DIAGNOSIS — G4711 Idiopathic hypersomnia with long sleep time: Secondary | ICD-10-CM | POA: Diagnosis not present

## 2017-04-11 DIAGNOSIS — G4710 Hypersomnia, unspecified: Secondary | ICD-10-CM

## 2017-04-11 LAB — POCT URINALYSIS DIPSTICK
Bilirubin, UA: NEGATIVE
Glucose, UA: NEGATIVE
Ketones, UA: NEGATIVE
Leukocytes, UA: NEGATIVE
NITRITE UA: NEGATIVE
PH UA: 5.5 (ref 5.0–8.0)
Protein, UA: NEGATIVE
UROBILINOGEN UA: 0.2 U/dL

## 2017-04-11 MED ORDER — MODAFINIL 100 MG PO TABS: 100.0000 mg | ORAL_TABLET | Freq: Every day | ORAL | 0 refills | Status: DC

## 2017-04-11 NOTE — Progress Notes (Signed)
Patient ID: Richard Fisher, male    DOB: [birth year], 46 y.o.   MRN: [id number]  Chief Complaint  Patient presents with  . Follow-up  . Hematuria    Allergies Amoxicillin and Levaquin [levofloxacin in d5w]  Subjective:   Richard Fisher is a 46 y.o. male who presents to [facility] today.  HPI Here for follow up. Has still been dealing with sleepiness. Sleeps from 8-12 with a nap.  Naps usually ranges from 2-4 hours. May take a second nap from 5-7 pm.  Goes to bed at 10 pm every night. Sleeps all night, wakes up at 3-3:30 to urinate. Uses melatonin for sleep at times. Seen by ENT, told that throat looks good. Was given neurontin for submandibular gland to use prn for chewing pain.  Reports he is still feeling fatigued during the day and that is why he has to take his naps.  Had sleep study evaluation done which revealed no disordered breathing or obstructive sleep apnea.  Reports that his mood is good.  He does not feel depressed or sad.  He was seen by psychotherapist in the past and told he possibly could be mildly dysthymic.  Exercises every day.  Avoids caffeine.  Is able to sleep well at night.  Does not actively fall asleep while driving a car.  If he is actively engaged with others he may feel sleepy but would not fall asleep.  His excessive sleepiness is affecting his lifestyle due to the fact that he needs to take naps during the day when he needs to be working.  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  Would like to review labs that he had performed by an outside testing agency.  Patient reports he wants to recheck his urine today.  Has had a history of episodic hematuria after diagnosis of the varicocele several years ago.  He denies any gross hematuria.  No history of kidney stones.      Hematuria  This is a chronic problem. The current episode started more than 1 year ago. The problem has been waxing and  waning since onset. He describes the hematuria as microscopic hematuria. He reports no clotting in his urine stream. His pain is at a severity of 0/10. He is experiencing no pain. He describes his urine color as clear. Irritative symptoms do not include frequency, nocturia or urgency. Obstructive symptoms do not include dribbling, incomplete emptying, straining or a weak stream. Pertinent negatives include no abdominal pain, chills, dysuria, facial swelling, fever, flank pain, genital pain, hesitancy, inability to urinate or vomiting. He is sexually active. There is no history of BPH, GU trauma, hypertension, prostatitis, recent infection, sickle cell disease, STDs or tobacco use.    Past Medical History:  Diagnosis Date  . Anxiety   . Fatigue   . Melanoma ([place])    stage 0 melanoma 05/2015/ followed by Dr. Magnus Sinning    Past Surgical History:  Procedure Laterality Date  . MELANOMA EXCISION     followed by Dr. Magnus Sinning in [facility]    Family History  Problem Relation Age of Onset  . Cancer Sister   . Cholecystitis Brother   . Thyroid disease Neg Hx        His mother had transient hypothyroidism     Social History   Socioeconomic History  . Marital status: Divorced    Spouse name: None  . Number  of children: 4  . Years of education: BS  . Highest education level: None  Social Needs  . Financial resource strain: None  . Food insecurity - worry: None  . Food insecurity - inability: None  . Transportation needs - medical: None  . Transportation needs - non-medical: None  Occupational History  . None  Tobacco Use  . Smoking status: Never Smoker  . Smokeless tobacco: Never Used  Substance and Sexual Activity  . Alcohol use: No  . Drug use: No  . Sexual activity: Yes  Other Topics Concern  . None  Social History Narrative   Drinks 1 caffeine drink a day. Getting married next year. Moved here 3 years ago to be near the mother of daughter. Works as Science writer.    Grew up in [place].     Review of Systems  Constitutional: Negative for appetite change, chills, diaphoresis, fever and unexpected weight change.  HENT: Negative for facial swelling and trouble swallowing.   Respiratory: Negative for choking, chest tightness and shortness of breath.   Cardiovascular: Negative for chest pain, palpitations and leg swelling.  Gastrointestinal: Negative for abdominal pain and vomiting.  Genitourinary: Positive for hematuria. Negative for decreased urine volume, dysuria, flank pain, frequency, hesitancy, incomplete emptying, nocturia, penile swelling, scrotal swelling, testicular pain and urgency.       Denies any gross hematuria.  Skin: Negative for rash.  Neurological: Negative for tremors, seizures, facial asymmetry and light-headedness.     Objective:   BP 130/74 (BP Location: Left Arm, Patient Position: Sitting, Cuff Size: Normal)   Pulse 96   Temp (!) 97.4 F (36.3 C) (Other (Comment))   Resp 16   Ht 5' 11"  (1.803 m)   Wt 228 lb 12 oz (103.8 kg)   SpO2 95%   BMI 31.90 kg/m   Physical Exam  Constitutional: He is oriented to person, place, and time. He appears well-developed and well-nourished.  Eyes: EOM are normal. Pupils are equal, round, and reactive to light.  Neck: Normal range of motion. Neck supple.  Cardiovascular: Normal rate, regular rhythm and normal heart sounds.  Pulmonary/Chest: Effort normal and breath sounds normal.  Neurological: He is alert and oriented to person, place, and time.  Psychiatric: He has a normal mood and affect. His behavior is normal. Judgment and thought content normal.  Vitals reviewed.    Assessment and Plan   1. Hematuria, unspecified type - POCT Urinalysis Dipstick - Ambulatory referral to Urology Discussed with patient today that he will need urologic evaluation and cystoscopy due to repeated hematuria/microscopic on UAs.  He is in agreement and would like to proceed with this treatment  plan. 2. Excessive sleepiness I called neurologist who had evaluated patient for his excessive sleepiness and who had performed the sleep study.  We were told by her nursing staff that she would return my call to discuss this patient.  Would like to discuss with neurologist why she did not believe patient fit the diagnosis of idiopathic hypersomnolence or excessive sleepiness and also get her opinion regarding treatment trial of medication for patient. 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 Patient counseled in detail regarding the risks of medication. Told to call or return to clinic if develop any worrisome signs or symptoms. Patient voiced understanding.  We discussed that we would try this medication because he has had success with it in the past.  We did discuss that his prescription today did not mean that we would continue this medication  long-term.  He agreed to follow-up in 1 month to discuss his symptoms.  We did discuss that if he continued this medicine that we would have to make sure that his blood pressure was well controlled. OV greater than 25 min.   We did review all the labs that patient had brought in and sent me via my chart.  His questions were answered.   Return in about 4 weeks (around 05/09/2017) for Follow-up. Caren Macadam, MD 04/12/2017

## 2017-04-11 NOTE — Telephone Encounter (Signed)
I will send Dr. Mannie Stabile a staff message today.

## 2017-04-11 NOTE — Telephone Encounter (Signed)
I called Denton Ar, she is unsure of what Dr. Roma Kayser specific questions are about this pt, but is asking that Dr. Rexene Alberts call Dr. Mannie Stabile to discuss at her earliest convenience.

## 2017-04-11 NOTE — Telephone Encounter (Signed)
Denton Ar [phone] calling for Dr Caren Macadam calling to speak with Dr Rexene Alberts. The patient is in her office now. Would appreciate a call back asap. Thank you

## 2017-04-12 ENCOUNTER — Encounter: Payer: Self-pay | Admitting: Family Medicine

## 2017-05-09 DIAGNOSIS — K045 Chronic apical periodontitis: Secondary | ICD-10-CM | POA: Diagnosis not present

## 2017-05-20 ENCOUNTER — Ambulatory Visit (INDEPENDENT_AMBULATORY_CARE_PROVIDER_SITE_OTHER): Payer: BLUE CROSS/BLUE SHIELD | Admitting: Otolaryngology

## 2017-05-20 DIAGNOSIS — R070 Pain in throat: Secondary | ICD-10-CM | POA: Diagnosis not present

## 2017-08-21 DIAGNOSIS — I861 Scrotal varices: Secondary | ICD-10-CM | POA: Diagnosis not present

## 2017-08-21 DIAGNOSIS — K6289 Other specified diseases of anus and rectum: Secondary | ICD-10-CM | POA: Diagnosis not present

## 2017-08-21 DIAGNOSIS — R3129 Other microscopic hematuria: Secondary | ICD-10-CM | POA: Diagnosis not present

## 2017-09-05 DIAGNOSIS — D0362 Melanoma in situ of left upper limb, including shoulder: Secondary | ICD-10-CM | POA: Diagnosis not present

## 2017-09-12 DIAGNOSIS — A6000 Herpesviral infection of urogenital system, unspecified: Secondary | ICD-10-CM | POA: Diagnosis not present

## 2017-09-16 DIAGNOSIS — Z08 Encounter for follow-up examination after completed treatment for malignant neoplasm: Secondary | ICD-10-CM | POA: Diagnosis not present

## 2017-09-16 DIAGNOSIS — Z85820 Personal history of malignant melanoma of skin: Secondary | ICD-10-CM | POA: Diagnosis not present

## 2017-09-16 DIAGNOSIS — D235 Other benign neoplasm of skin of trunk: Secondary | ICD-10-CM | POA: Diagnosis not present

## 2017-09-16 DIAGNOSIS — Z1283 Encounter for screening for malignant neoplasm of skin: Secondary | ICD-10-CM | POA: Diagnosis not present

## 2017-11-01 DIAGNOSIS — J309 Allergic rhinitis, unspecified: Secondary | ICD-10-CM | POA: Diagnosis not present

## 2017-11-08 ENCOUNTER — Encounter: Payer: Self-pay | Admitting: Family Medicine

## 2017-12-16 DIAGNOSIS — Z08 Encounter for follow-up examination after completed treatment for malignant neoplasm: Secondary | ICD-10-CM | POA: Diagnosis not present

## 2017-12-16 DIAGNOSIS — Z1283 Encounter for screening for malignant neoplasm of skin: Secondary | ICD-10-CM | POA: Diagnosis not present

## 2017-12-16 DIAGNOSIS — D235 Other benign neoplasm of skin of trunk: Secondary | ICD-10-CM | POA: Diagnosis not present

## 2017-12-16 DIAGNOSIS — Z85820 Personal history of malignant melanoma of skin: Secondary | ICD-10-CM | POA: Diagnosis not present

## 2018-01-03 DIAGNOSIS — G2581 Restless legs syndrome: Secondary | ICD-10-CM | POA: Diagnosis not present

## 2018-01-03 DIAGNOSIS — G4710 Hypersomnia, unspecified: Secondary | ICD-10-CM | POA: Diagnosis not present

## 2018-01-06 DIAGNOSIS — Z760 Encounter for issue of repeat prescription: Secondary | ICD-10-CM | POA: Diagnosis not present

## 2018-01-06 DIAGNOSIS — G4711 Idiopathic hypersomnia with long sleep time: Secondary | ICD-10-CM | POA: Diagnosis not present

## 2018-02-11 DIAGNOSIS — Y9367 Activity, basketball: Secondary | ICD-10-CM | POA: Diagnosis not present

## 2018-02-11 DIAGNOSIS — X500XXA Overexertion from strenuous movement or load, initial encounter: Secondary | ICD-10-CM | POA: Diagnosis not present

## 2018-02-11 DIAGNOSIS — S93402A Sprain of unspecified ligament of left ankle, initial encounter: Secondary | ICD-10-CM | POA: Diagnosis not present

## 2018-03-18 DIAGNOSIS — M546 Pain in thoracic spine: Secondary | ICD-10-CM | POA: Diagnosis not present

## 2018-03-18 DIAGNOSIS — M5413 Radiculopathy, cervicothoracic region: Secondary | ICD-10-CM | POA: Diagnosis not present

## 2018-03-18 DIAGNOSIS — M5023 Other cervical disc displacement, cervicothoracic region: Secondary | ICD-10-CM | POA: Diagnosis not present

## 2018-03-18 DIAGNOSIS — M5137 Other intervertebral disc degeneration, lumbosacral region: Secondary | ICD-10-CM | POA: Diagnosis not present

## 2018-03-18 DIAGNOSIS — M4306 Spondylolysis, lumbar region: Secondary | ICD-10-CM | POA: Diagnosis not present

## 2018-03-18 DIAGNOSIS — M4316 Spondylolisthesis, lumbar region: Secondary | ICD-10-CM | POA: Diagnosis not present

## 2018-03-18 DIAGNOSIS — C439 Malignant melanoma of skin, unspecified: Secondary | ICD-10-CM | POA: Diagnosis not present

## 2018-03-18 DIAGNOSIS — M5136 Other intervertebral disc degeneration, lumbar region: Secondary | ICD-10-CM | POA: Diagnosis not present

## 2018-03-18 DIAGNOSIS — M542 Cervicalgia: Secondary | ICD-10-CM | POA: Diagnosis not present

## 2018-03-20 DIAGNOSIS — M542 Cervicalgia: Secondary | ICD-10-CM | POA: Diagnosis not present

## 2018-03-20 DIAGNOSIS — M5023 Other cervical disc displacement, cervicothoracic region: Secondary | ICD-10-CM | POA: Diagnosis not present

## 2018-03-20 DIAGNOSIS — M546 Pain in thoracic spine: Secondary | ICD-10-CM | POA: Diagnosis not present

## 2018-03-20 DIAGNOSIS — R918 Other nonspecific abnormal finding of lung field: Secondary | ICD-10-CM | POA: Diagnosis not present

## 2018-03-20 DIAGNOSIS — M5413 Radiculopathy, cervicothoracic region: Secondary | ICD-10-CM | POA: Diagnosis not present

## 2018-03-21 DIAGNOSIS — D649 Anemia, unspecified: Secondary | ICD-10-CM | POA: Diagnosis not present

## 2018-03-21 DIAGNOSIS — R531 Weakness: Secondary | ICD-10-CM | POA: Diagnosis not present

## 2018-03-21 DIAGNOSIS — R911 Solitary pulmonary nodule: Secondary | ICD-10-CM | POA: Diagnosis not present

## 2018-03-21 DIAGNOSIS — E7849 Other hyperlipidemia: Secondary | ICD-10-CM | POA: Diagnosis not present

## 2018-03-21 DIAGNOSIS — R42 Dizziness and giddiness: Secondary | ICD-10-CM | POA: Diagnosis not present

## 2018-03-21 DIAGNOSIS — R072 Precordial pain: Secondary | ICD-10-CM | POA: Diagnosis not present

## 2018-03-21 DIAGNOSIS — E119 Type 2 diabetes mellitus without complications: Secondary | ICD-10-CM | POA: Diagnosis not present

## 2018-03-22 DIAGNOSIS — M5413 Radiculopathy, cervicothoracic region: Secondary | ICD-10-CM | POA: Diagnosis not present

## 2018-03-22 DIAGNOSIS — M546 Pain in thoracic spine: Secondary | ICD-10-CM | POA: Diagnosis not present

## 2018-03-22 DIAGNOSIS — M5023 Other cervical disc displacement, cervicothoracic region: Secondary | ICD-10-CM | POA: Diagnosis not present

## 2018-03-22 DIAGNOSIS — M542 Cervicalgia: Secondary | ICD-10-CM | POA: Diagnosis not present

## 2018-03-24 DIAGNOSIS — M5413 Radiculopathy, cervicothoracic region: Secondary | ICD-10-CM | POA: Diagnosis not present

## 2018-03-24 DIAGNOSIS — M542 Cervicalgia: Secondary | ICD-10-CM | POA: Diagnosis not present

## 2018-03-24 DIAGNOSIS — M546 Pain in thoracic spine: Secondary | ICD-10-CM | POA: Diagnosis not present

## 2018-03-24 DIAGNOSIS — M5023 Other cervical disc displacement, cervicothoracic region: Secondary | ICD-10-CM | POA: Diagnosis not present

## 2018-03-26 DIAGNOSIS — K7689 Other specified diseases of liver: Secondary | ICD-10-CM | POA: Diagnosis not present

## 2018-03-26 DIAGNOSIS — M542 Cervicalgia: Secondary | ICD-10-CM | POA: Diagnosis not present

## 2018-03-26 DIAGNOSIS — M5413 Radiculopathy, cervicothoracic region: Secondary | ICD-10-CM | POA: Diagnosis not present

## 2018-03-26 DIAGNOSIS — R0602 Shortness of breath: Secondary | ICD-10-CM | POA: Diagnosis not present

## 2018-03-26 DIAGNOSIS — M5023 Other cervical disc displacement, cervicothoracic region: Secondary | ICD-10-CM | POA: Diagnosis not present

## 2018-03-26 DIAGNOSIS — M546 Pain in thoracic spine: Secondary | ICD-10-CM | POA: Diagnosis not present

## 2018-03-27 DIAGNOSIS — M5023 Other cervical disc displacement, cervicothoracic region: Secondary | ICD-10-CM | POA: Diagnosis not present

## 2018-03-27 DIAGNOSIS — M546 Pain in thoracic spine: Secondary | ICD-10-CM | POA: Diagnosis not present

## 2018-03-27 DIAGNOSIS — M542 Cervicalgia: Secondary | ICD-10-CM | POA: Diagnosis not present

## 2018-03-27 DIAGNOSIS — M5413 Radiculopathy, cervicothoracic region: Secondary | ICD-10-CM | POA: Diagnosis not present

## 2018-04-14 DIAGNOSIS — R740 Nonspecific elevation of levels of transaminase and lactic acid dehydrogenase [LDH]: Secondary | ICD-10-CM | POA: Diagnosis not present

## 2018-04-17 DIAGNOSIS — M542 Cervicalgia: Secondary | ICD-10-CM | POA: Diagnosis not present

## 2018-04-17 DIAGNOSIS — R202 Paresthesia of skin: Secondary | ICD-10-CM | POA: Diagnosis not present

## 2018-04-24 DIAGNOSIS — M50222 Other cervical disc displacement at C5-C6 level: Secondary | ICD-10-CM | POA: Diagnosis not present

## 2018-04-24 DIAGNOSIS — M50221 Other cervical disc displacement at C4-C5 level: Secondary | ICD-10-CM | POA: Diagnosis not present

## 2018-05-16 DIAGNOSIS — D235 Other benign neoplasm of skin of trunk: Secondary | ICD-10-CM | POA: Diagnosis not present

## 2018-05-16 DIAGNOSIS — Z1283 Encounter for screening for malignant neoplasm of skin: Secondary | ICD-10-CM | POA: Diagnosis not present

## 2018-05-16 DIAGNOSIS — Z08 Encounter for follow-up examination after completed treatment for malignant neoplasm: Secondary | ICD-10-CM | POA: Diagnosis not present

## 2018-05-16 DIAGNOSIS — Z85820 Personal history of malignant melanoma of skin: Secondary | ICD-10-CM | POA: Diagnosis not present

## 2018-06-06 DIAGNOSIS — L603 Nail dystrophy: Secondary | ICD-10-CM | POA: Diagnosis not present

## 2018-10-29 IMAGING — DX DG CERVICAL SPINE 2 OR 3 VIEWS
4 series · 4 of 4 positions shown · non-contrast
Comparison: None.

CLINICAL DATA: Arm numbness.  Laterality not specified.

EXAM:
CERVICAL SPINE - 2-3 VIEW

[c-spine lat]
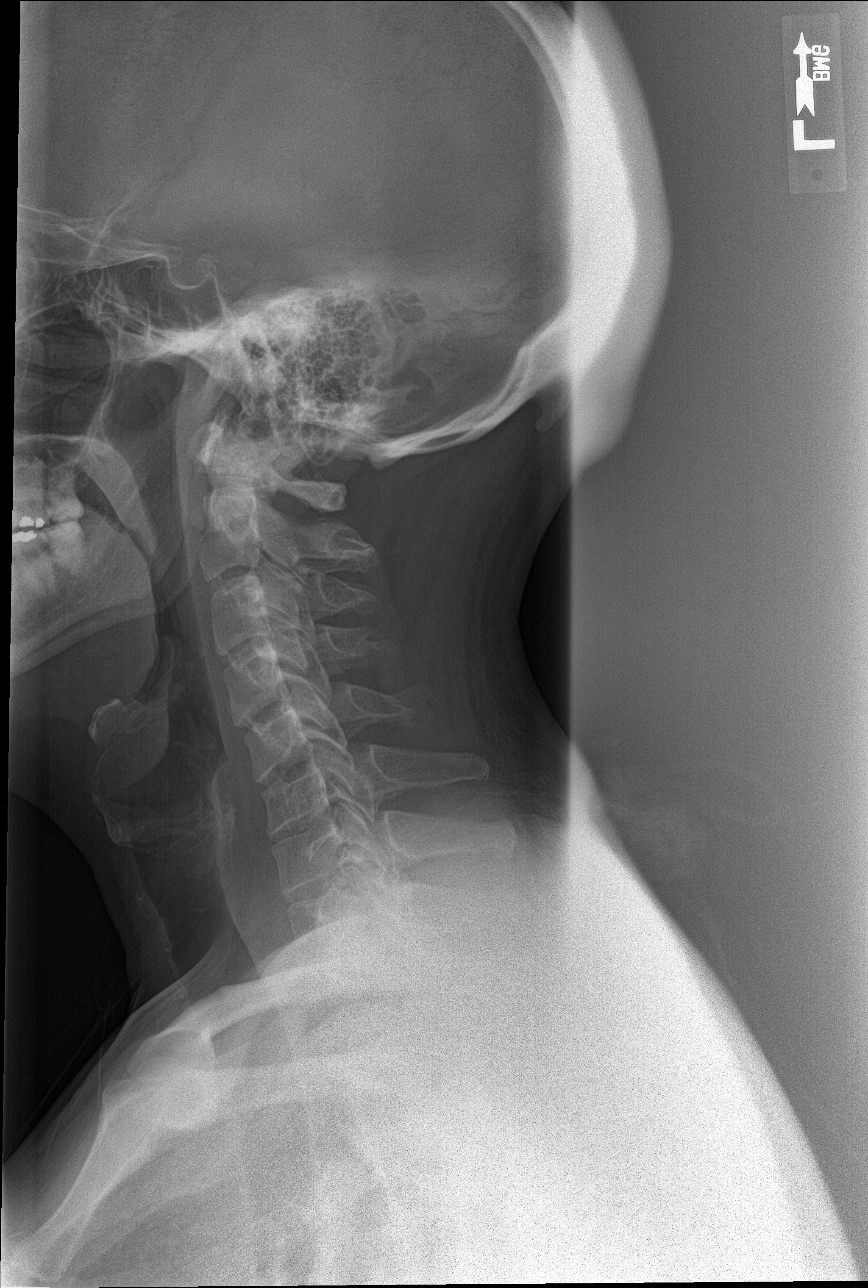

[c-spine ap]
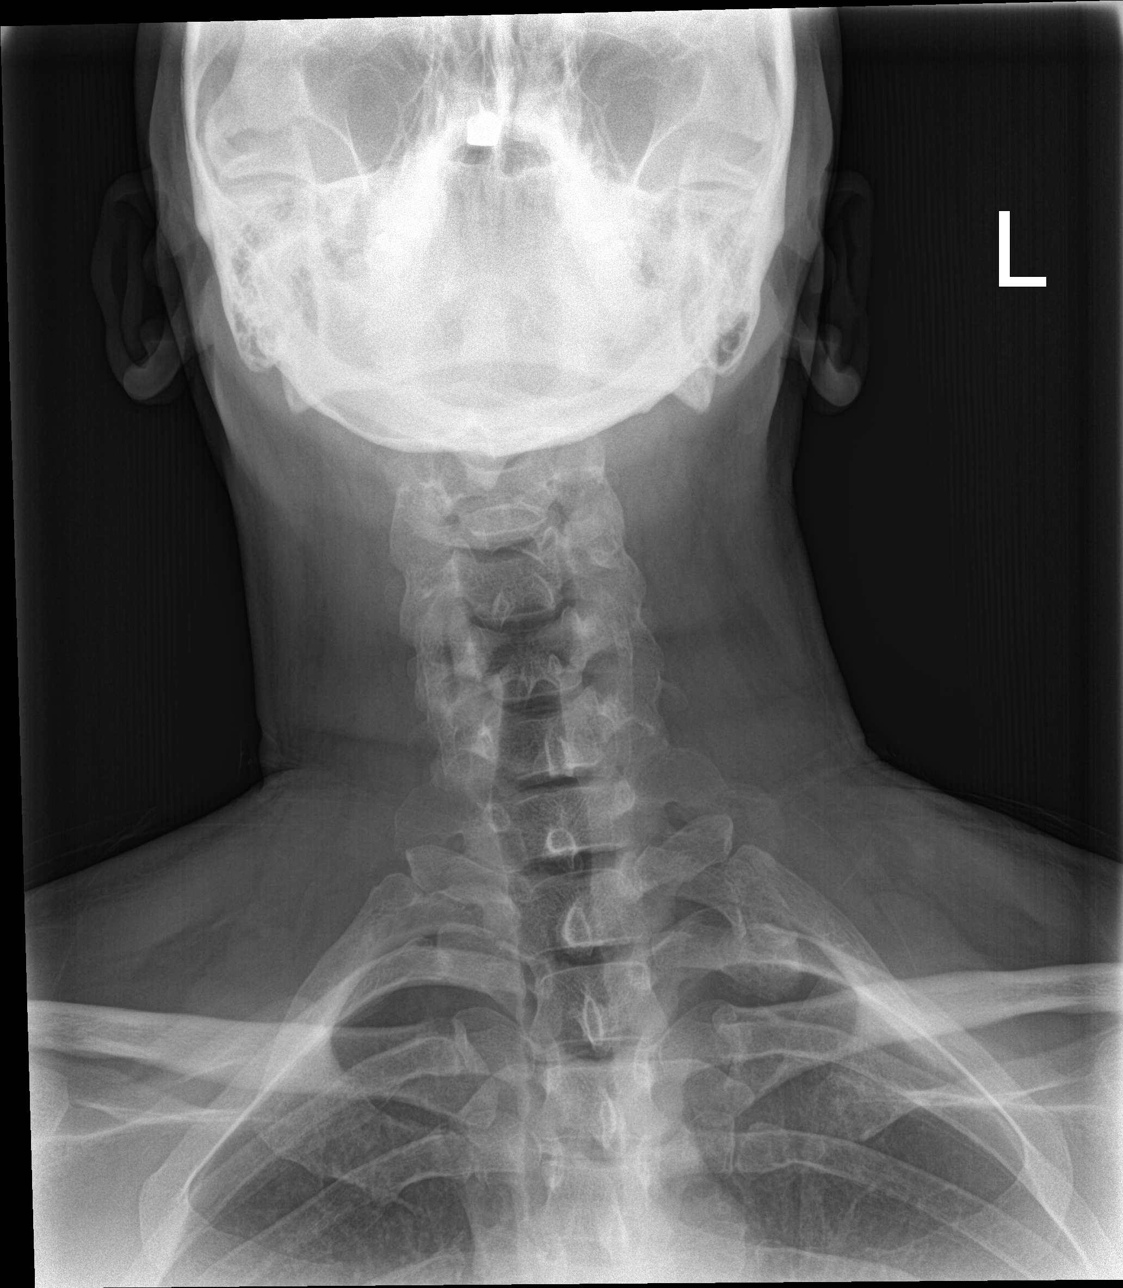

[[person_name]]
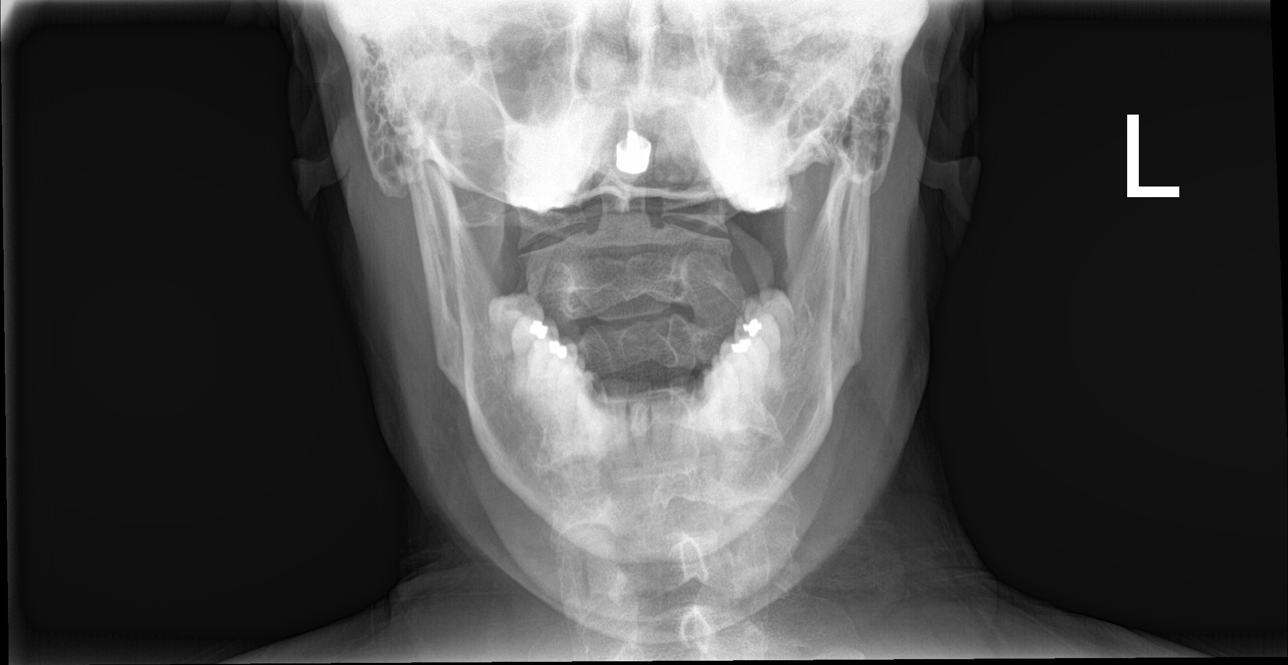

[c-spine swimmers]
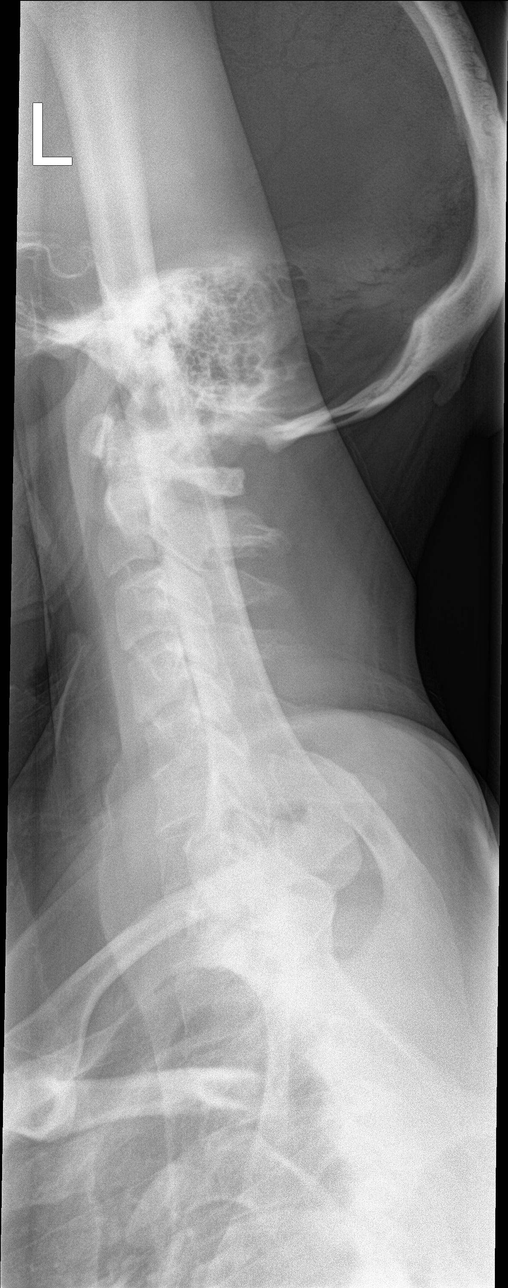

[4 of 4 positions shown; findings below may reference images not displayed]

FINDINGS: The cervical vertebral bodies are normally aligned. Disc spaces and
vertebral bodies are maintained. No significant degenerative
changes. No acute bony findings or abnormal prevertebral soft tissue
swelling. The facets are normally aligned. The neural foramen are
patent. The C1-2 articulations are maintained. Small cervical ribs
noted. The lung apices are clear.
IMPRESSION: Normal alignment and no acute bony findings or significant
degenerative changes.

## 2018-12-26 DIAGNOSIS — D229 Melanocytic nevi, unspecified: Secondary | ICD-10-CM | POA: Diagnosis not present

## 2018-12-29 DIAGNOSIS — F419 Anxiety disorder, unspecified: Secondary | ICD-10-CM | POA: Diagnosis not present

## 2018-12-29 DIAGNOSIS — E789 Disorder of lipoprotein metabolism, unspecified: Secondary | ICD-10-CM | POA: Diagnosis not present

## 2018-12-29 DIAGNOSIS — F331 Major depressive disorder, recurrent, moderate: Secondary | ICD-10-CM | POA: Diagnosis not present

## 2018-12-29 DIAGNOSIS — G4700 Insomnia, unspecified: Secondary | ICD-10-CM | POA: Diagnosis not present

## 2018-12-29 DIAGNOSIS — E119 Type 2 diabetes mellitus without complications: Secondary | ICD-10-CM | POA: Diagnosis not present

## 2018-12-29 DIAGNOSIS — R531 Weakness: Secondary | ICD-10-CM | POA: Diagnosis not present

## 2018-12-29 DIAGNOSIS — D649 Anemia, unspecified: Secondary | ICD-10-CM | POA: Diagnosis not present

## 2019-01-05 DIAGNOSIS — D485 Neoplasm of uncertain behavior of skin: Secondary | ICD-10-CM | POA: Diagnosis not present

## 2019-01-05 DIAGNOSIS — Z85820 Personal history of malignant melanoma of skin: Secondary | ICD-10-CM | POA: Diagnosis not present

## 2019-01-19 DIAGNOSIS — D485 Neoplasm of uncertain behavior of skin: Secondary | ICD-10-CM | POA: Diagnosis not present

## 2019-03-02 IMAGING — US US ART/VEN ABD/PELV/SCROTUM DOPPLER LTD
1 series · 14 of 25 positions shown · non-contrast
Comparison: 08/11/2016

CLINICAL DATA: Testicle pain for 2 weeks left greater than right

EXAM:
SCROTAL ULTRASOUND
DOPPLER ULTRASOUND OF THE TESTICLES
TECHNIQUE: Complete ultrasound examination of the testicles, epididymis, and
other scrotal structures was performed. Color and spectral Doppler
ultrasound were also utilized to evaluate blood flow to the
testicles.

[Series 1: us art/ven abd/pelv/scrotum doppler ltd · 0.07mm/px · 14 of 62 slices shown]
[im 1/62]
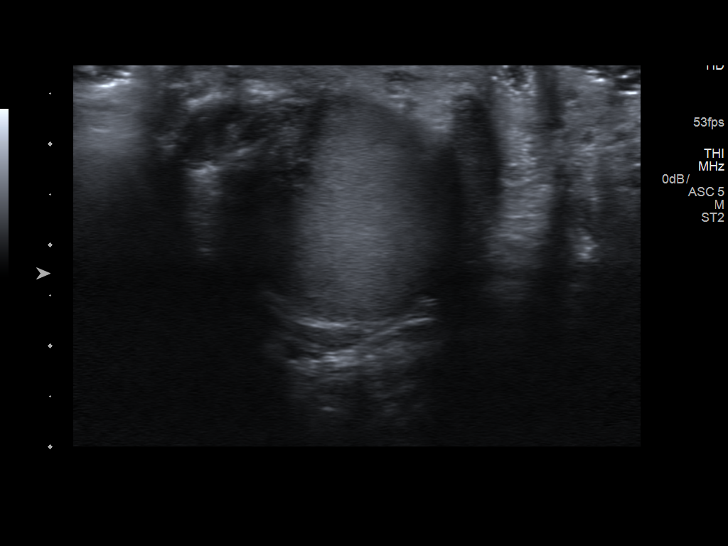
[im 6/62]
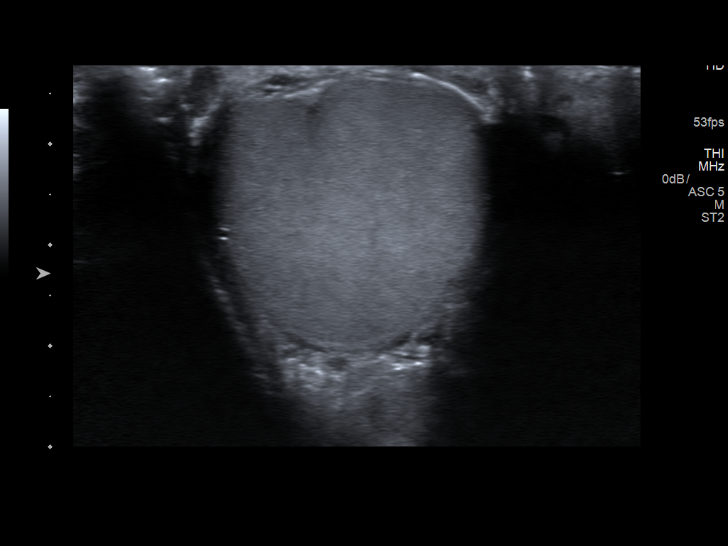
[im 11/62]
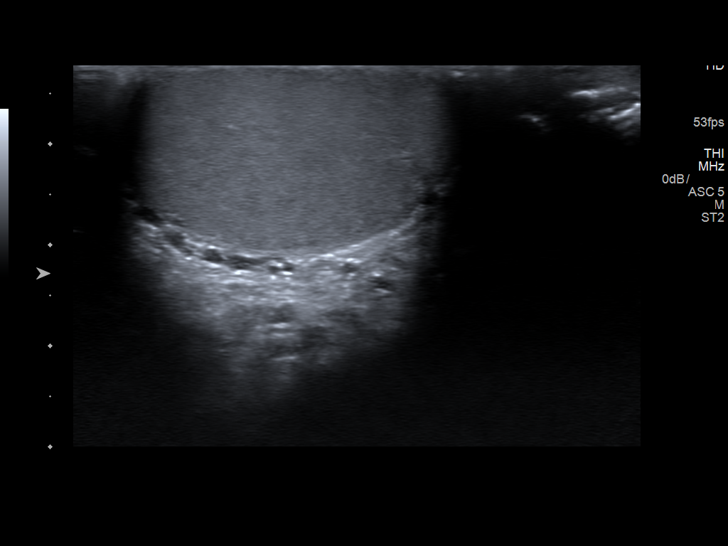
[im 16/62]
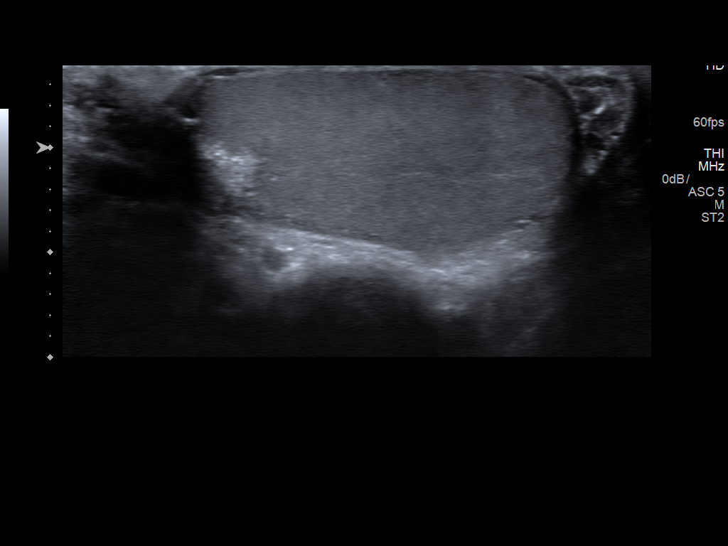
[im 21/62]
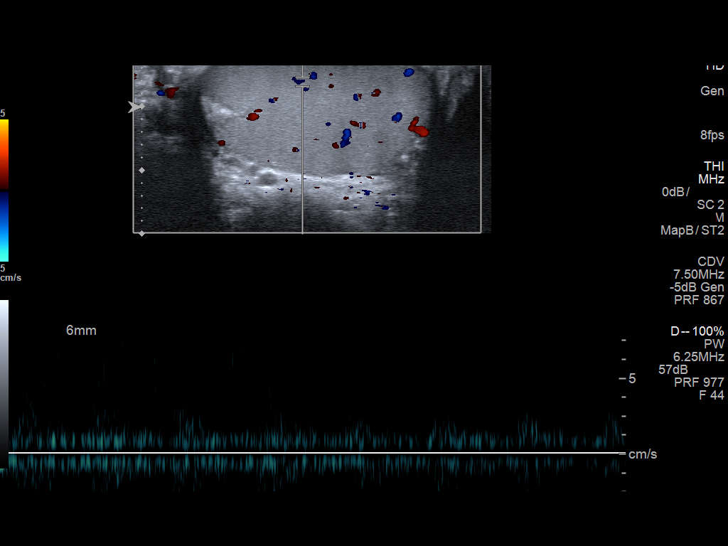
[im 23/62]
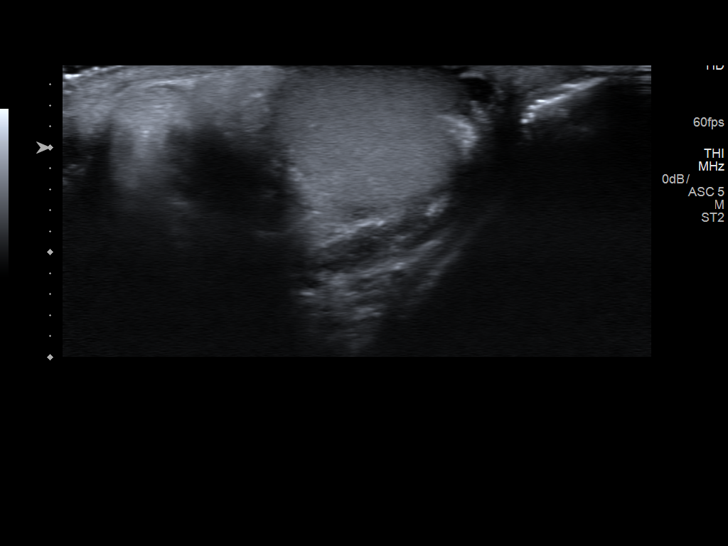
[im 28/62]
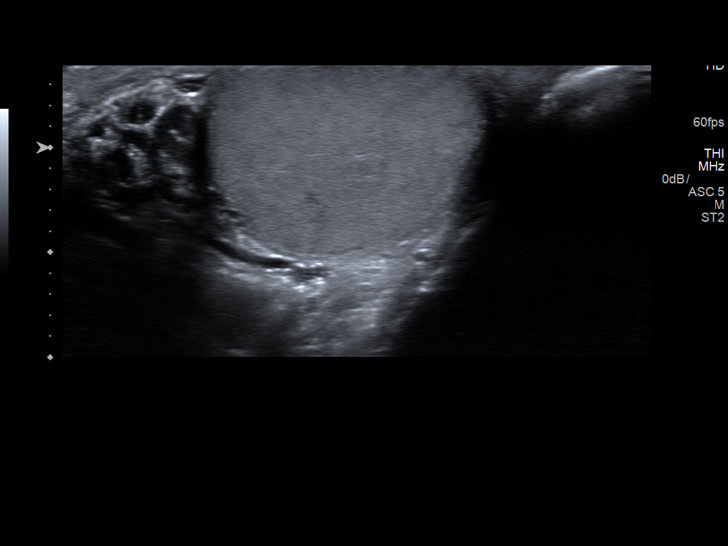
[im 34/62]
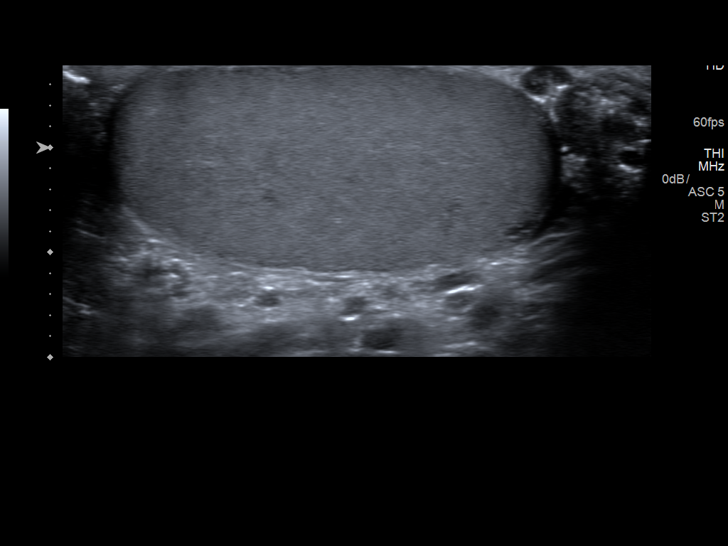
[im 39/62]
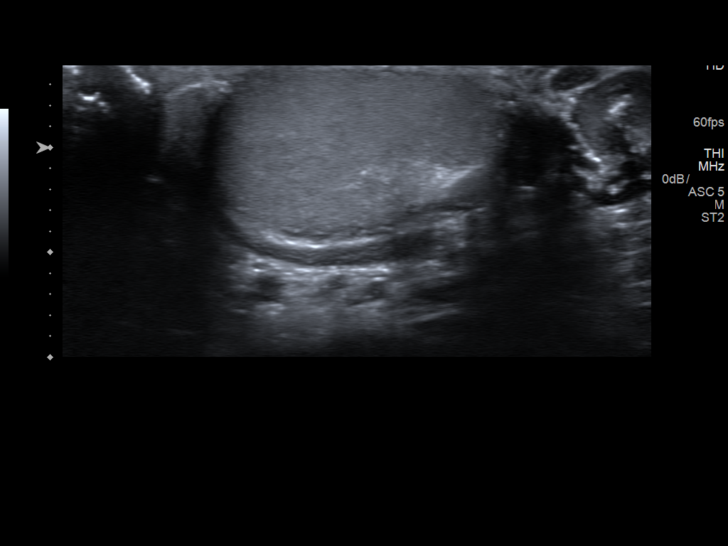
[im 41/62]
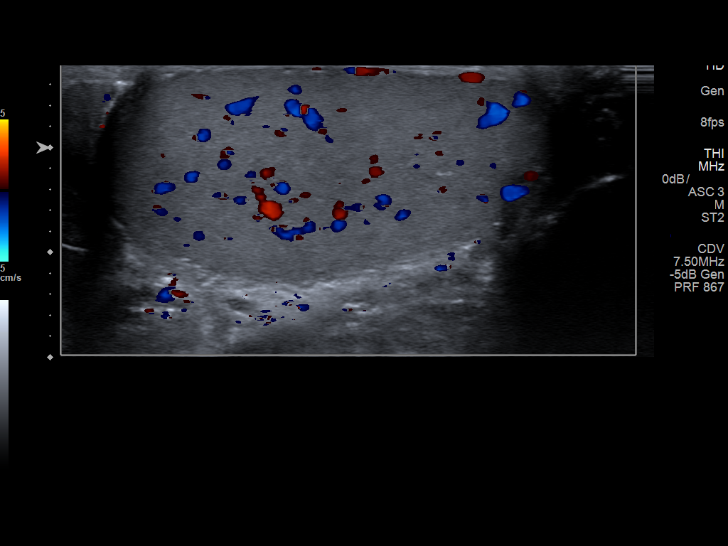
[im 46/62]
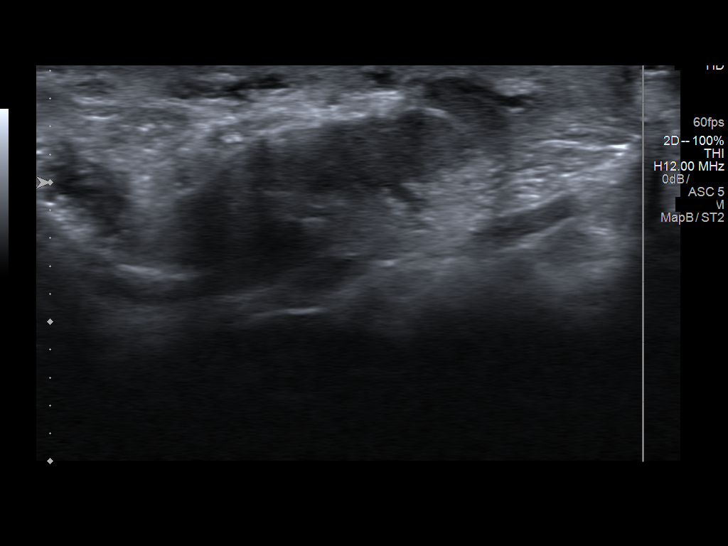
[im 51/62]
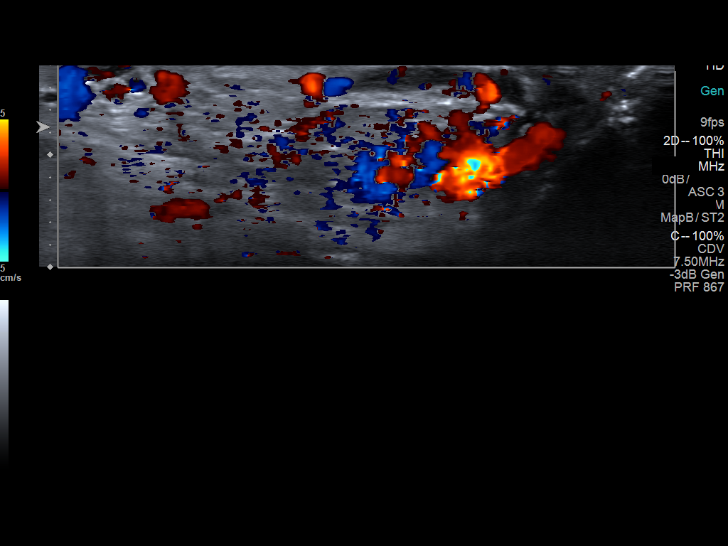
[im 56/62]
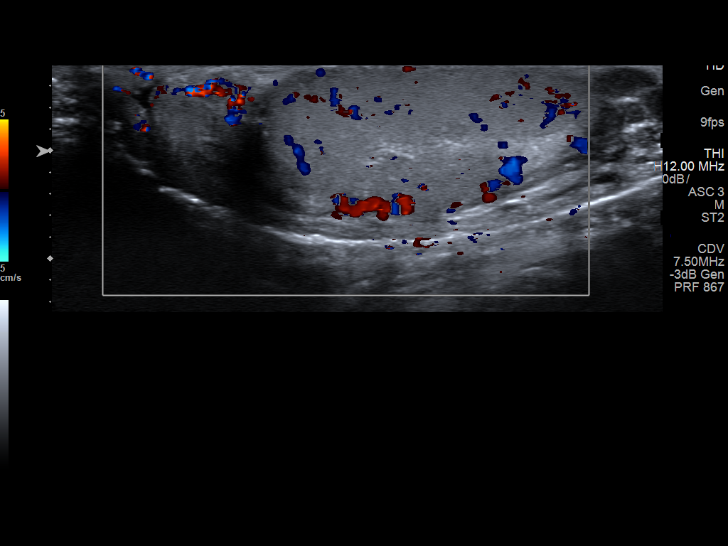
[im 62/62]
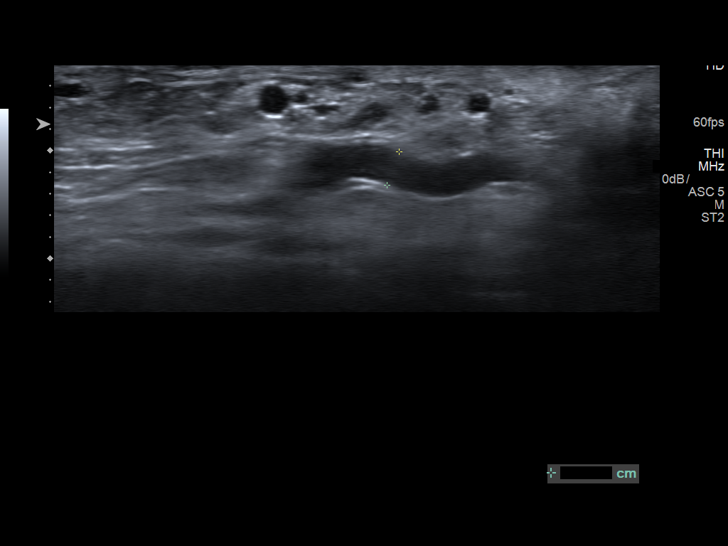

[14 of 25 positions shown; findings below may reference images not displayed]

FINDINGS: Right testicle

Measurements: 4.1 x 1.8 x 2.8 cm. No mass or microlithiasis
visualized.

Left testicle

Measurements: 4.2 x 2 x 3.0 cm. No mass or microlithiasis
visualized.

Right epididymis:  Normal in size and appearance.

Left epididymis:  Normal in size and appearance.

Hydrocele:  None visualized.

Varicocele:  Small left varicocele.

Pulsed Doppler interrogation of both testes demonstrates normal low
resistance arterial and venous waveforms bilaterally.
IMPRESSION: 1. Negative for testicular torsion or testicular mass
2. Small left varicocele

## 2019-04-28 DIAGNOSIS — R42 Dizziness and giddiness: Secondary | ICD-10-CM | POA: Diagnosis not present

## 2019-04-28 DIAGNOSIS — R072 Precordial pain: Secondary | ICD-10-CM | POA: Diagnosis not present

## 2019-04-29 DIAGNOSIS — N2889 Other specified disorders of kidney and ureter: Secondary | ICD-10-CM | POA: Diagnosis not present

## 2019-04-29 DIAGNOSIS — K7689 Other specified diseases of liver: Secondary | ICD-10-CM | POA: Diagnosis not present

## 2019-05-12 DIAGNOSIS — R0602 Shortness of breath: Secondary | ICD-10-CM | POA: Diagnosis not present

## 2019-05-12 IMAGING — DX DG HIP (WITH OR WITHOUT PELVIS) 2-3V*L*
3 series · 3 of 3 positions shown · non-contrast
Comparison: None.

CLINICAL DATA: Left hip pain starting this morning, no injury.

EXAM:
DG HIP (WITH OR WITHOUT PELVIS) 2-3V LEFT

[pelvis ap]
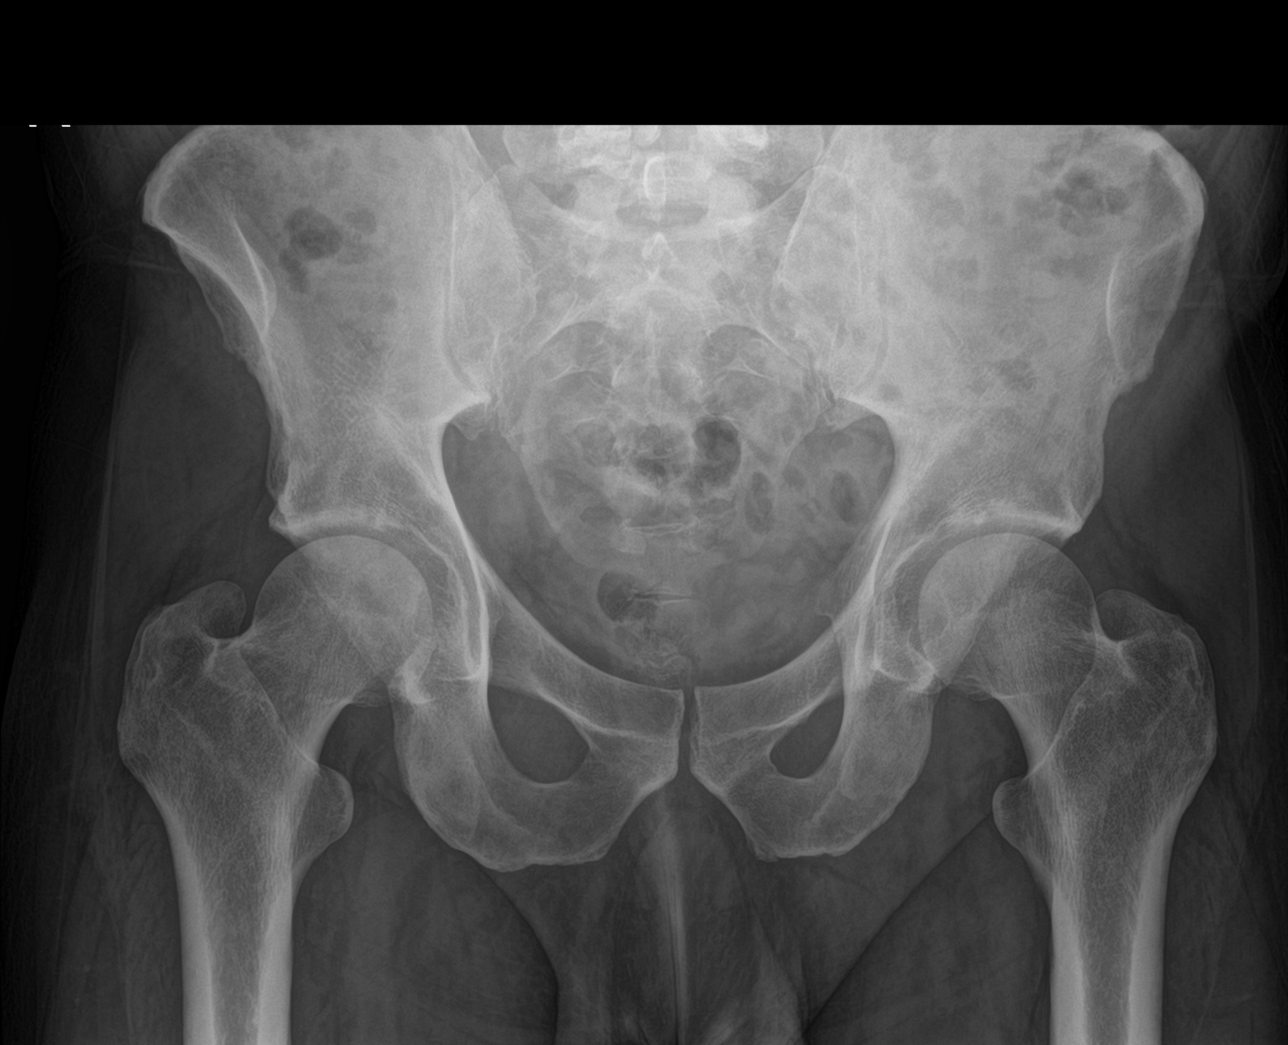

[hip ap]
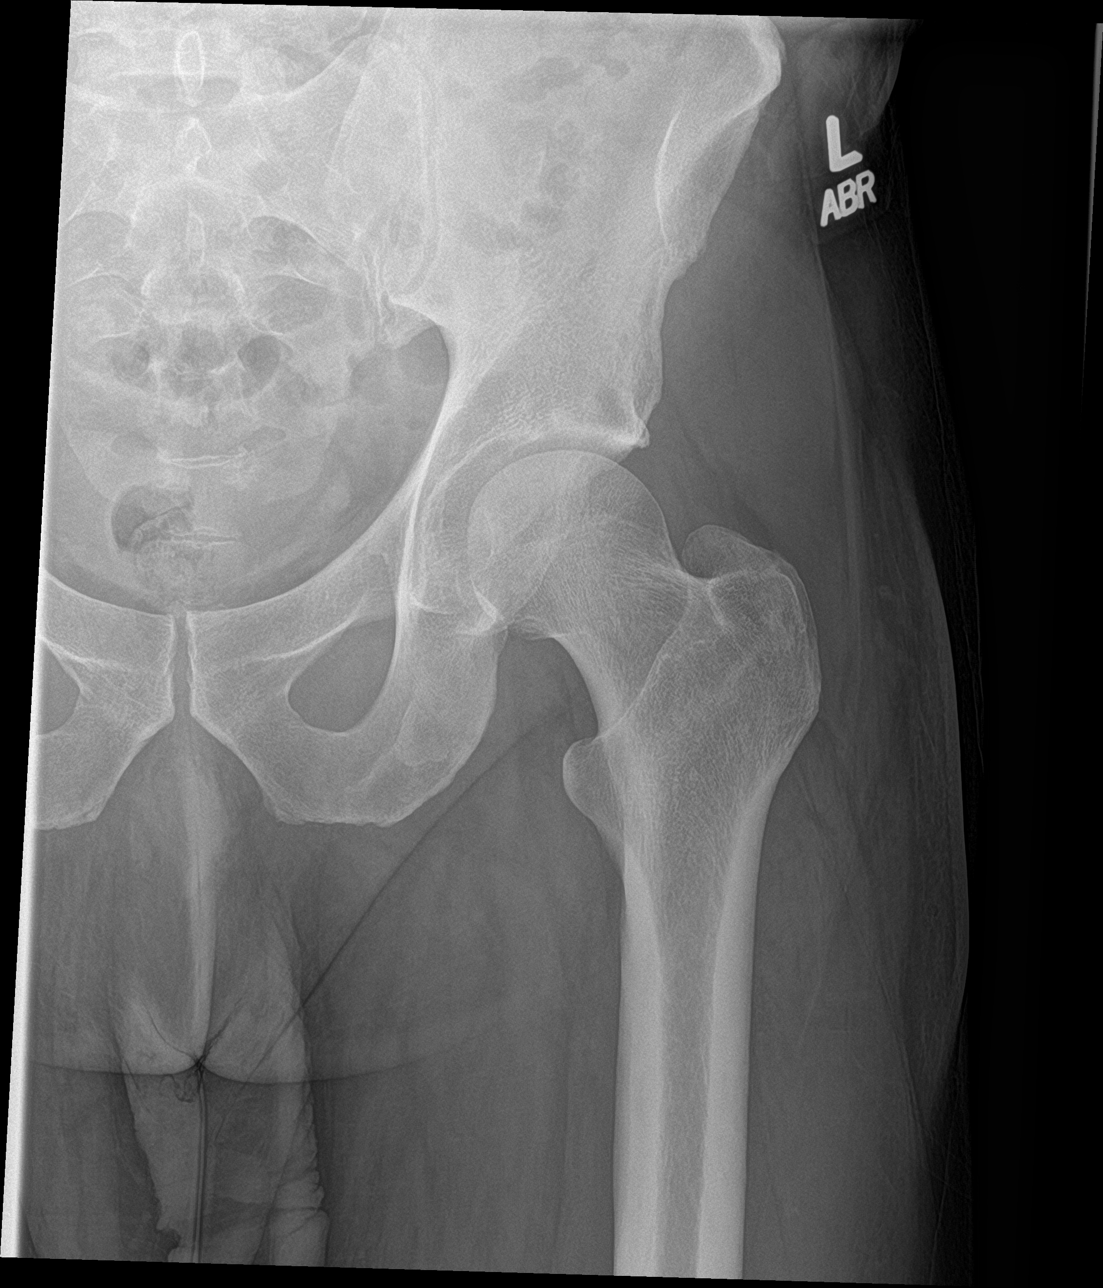

[hip lat]
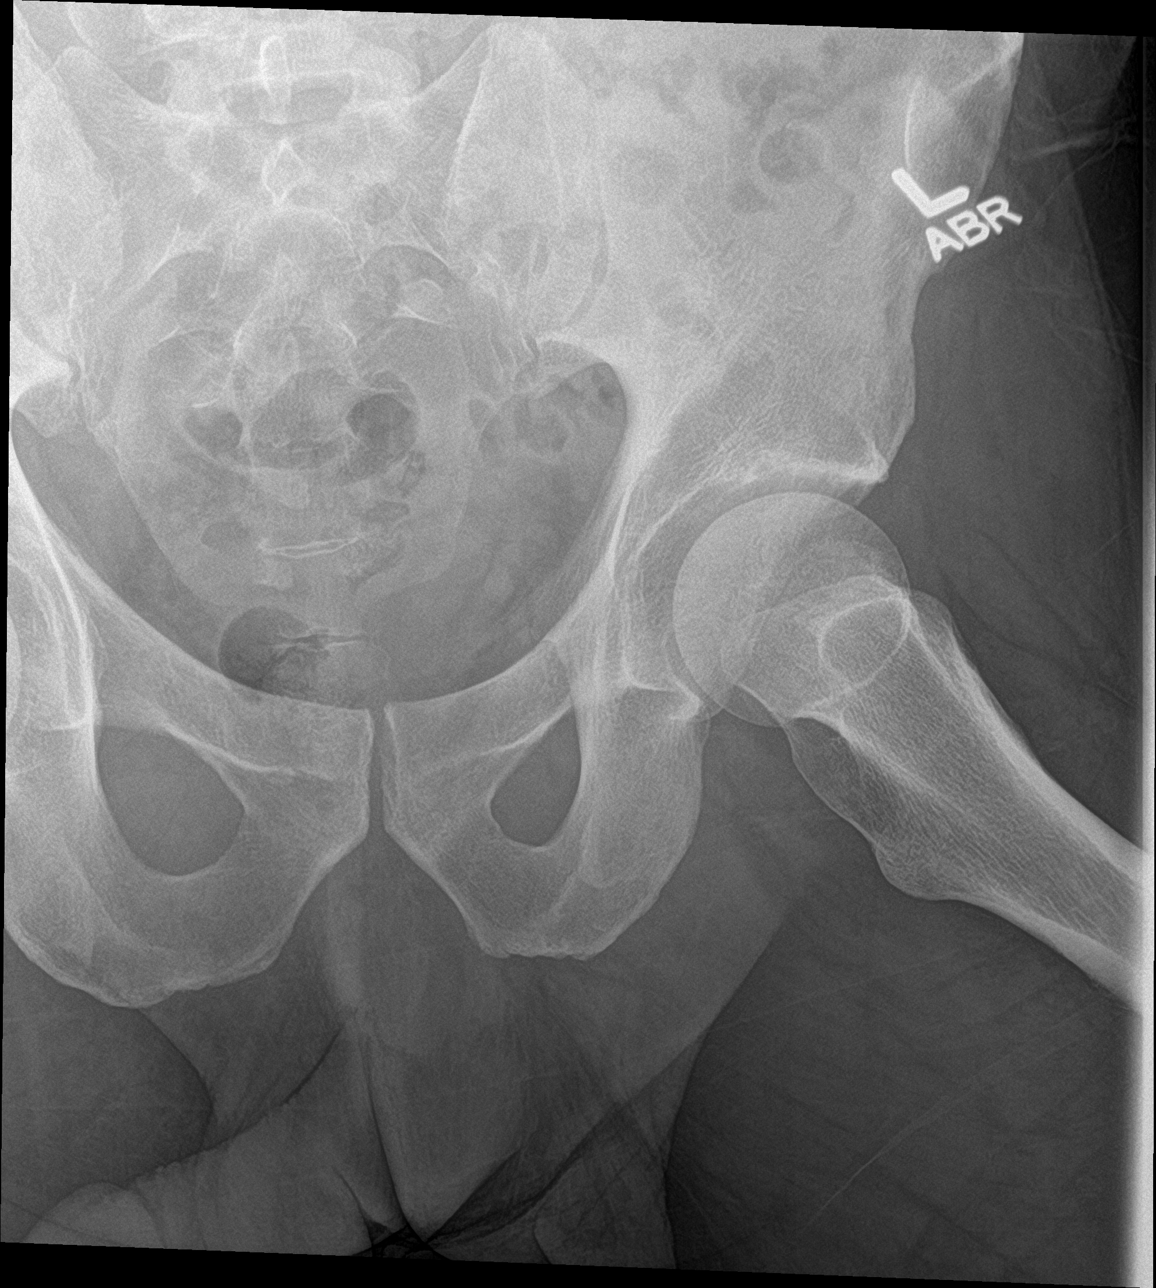

[3 of 3 positions shown; findings below may reference images not displayed]

FINDINGS: There is no evidence of hip fracture or dislocation. Minimal narrow
bilateral hip joint spaces are noted.
IMPRESSION: No acute abnormality.

## 2020-08-08 ENCOUNTER — Ambulatory Visit: Payer: BLUE CROSS/BLUE SHIELD | Admitting: Physician Assistant

## 2020-11-09 ENCOUNTER — Other Ambulatory Visit: Payer: Self-pay

## 2020-11-09 ENCOUNTER — Ambulatory Visit: Payer: Managed Care, Other (non HMO) | Admitting: Nurse Practitioner

## 2020-11-09 ENCOUNTER — Other Ambulatory Visit (HOSPITAL_COMMUNITY): Payer: Self-pay | Admitting: Nurse Practitioner

## 2020-11-09 ENCOUNTER — Encounter: Payer: Self-pay | Admitting: Nurse Practitioner

## 2020-11-09 VITALS — BP 133/78 | HR 62 | Temp 98.8°F | Resp 18 | Ht 71.0 in | Wt 232.0 lb

## 2020-11-09 DIAGNOSIS — N6310 Unspecified lump in the right breast, unspecified quadrant: Secondary | ICD-10-CM

## 2020-11-09 DIAGNOSIS — R400 Somnolence: Secondary | ICD-10-CM

## 2020-11-09 DIAGNOSIS — R6882 Decreased libido: Secondary | ICD-10-CM | POA: Insufficient documentation

## 2020-11-09 DIAGNOSIS — Z7689 Persons encountering health services in other specified circumstances: Secondary | ICD-10-CM | POA: Diagnosis not present

## 2020-11-09 DIAGNOSIS — W57XXXA Bitten or stung by nonvenomous insect and other nonvenomous arthropods, initial encounter: Secondary | ICD-10-CM

## 2020-11-09 DIAGNOSIS — Z139 Encounter for screening, unspecified: Secondary | ICD-10-CM

## 2020-11-09 DIAGNOSIS — R3129 Other microscopic hematuria: Secondary | ICD-10-CM

## 2020-11-09 DIAGNOSIS — S1096XA Insect bite of unspecified part of neck, initial encounter: Secondary | ICD-10-CM

## 2020-11-09 DIAGNOSIS — E559 Vitamin D deficiency, unspecified: Secondary | ICD-10-CM

## 2020-11-09 NOTE — Assessment & Plan Note (Signed)
-  had a lesion to back of neck and he states it had 2 bite marks in the center of it and had bullseye rash -will check lyme titer

## 2020-11-09 NOTE — Assessment & Plan Note (Addendum)
-  he states that he has decreased libido and would like urology referral -he states this occurred after he had a varicocele -will honor that request

## 2020-11-09 NOTE — Assessment & Plan Note (Signed)
-  he would like U/A with next set of labs to see if this resolved

## 2020-11-09 NOTE — Assessment & Plan Note (Signed)
-  will screen for HCV and HIV with next labs -he would like cologuard testing for colon CA screening; his father had colon cancer in his 73s -we discussed that cologuard is not for high risk patients (with family hx of colon CA) and that colonoscopy would be the preferred method of colon CA screening, but he insists that he would rather have cologuard -cologuard ordered

## 2020-11-09 NOTE — Progress Notes (Signed)
Acute Office Visit  Subjective:    Patient ID: Richard Fisher, male    DOB: [birth year], 50 y.o.   MRN: [id number]  Chief Complaint  Patient presents with  . New Patient (Initial Visit)    HPI Patient is in today for new patient visit. Transferring care from Dr. Felisa Bonier in [facility] Last physical was Jan/Feb 2022. Last labs were drawn at that time.  He is concerned with a skin lesion to the back of his list. He states he has derm appointment at the end of the month.  He states he has had swollen glands since he had a sore throat in March.  She states that he had varicocele in the past, and he had decreased libido after that. He would like urology referral.  He states that he had blood in his urine.He is unsure if this resolved.  He states that he has right breast nodules. Was supposed to get imaging in [place], but he state he never got called back.  His father died of colon cancer.  Had a bite on back of neck that had a target lesion, but this occurred in Feb 2020. He states that it has been scaling up and getting better and scaling up and getting better. He would like lyme titer.  Past Medical History:  Diagnosis Date  . Anxiety   . Fatigue   . Melanoma ([place])    stage 0 melanoma 05/2015/ followed by Dr. Magnus Sinning  . Melanoma of skin ([place]) 02/27/2017    Past Surgical History:  Procedure Laterality Date  . MELANOMA EXCISION     followed by Dr. Magnus Sinning in [facility]    Family History  Problem Relation Age of Onset  . Cancer Sister   . Cholecystitis Brother   . Thyroid disease Neg Hx        His mother had transient hypothyroidism    Social History   Socioeconomic History  . Marital status: Divorced    Spouse name: Not on file  . Number of children: 4  . Years of education: BS  . Highest education level: Not on file  Occupational History  . Occupation: Self-emplyed    Comment: Gaffer  Tobacco Use  . Smoking status: Never  Smoker  . Smokeless tobacco: Never Used  Vaping Use  . Vaping Use: Never used  Substance and Sexual Activity  . Alcohol use: No    Comment: maybe a beer ever 2-3 months  . Drug use: Yes    Types: Marijuana    Comment: occasionally when in a MJ-friendly state; last use over a year ago  . Sexual activity: Not Currently  Other Topics Concern  . Not on file  Social History Narrative   Drinks 1 caffeine drink a day. Getting married next year. Moved here 3 years ago to be near the mother of daughter. Works as Occupational psychologist.    Grew up in [place].    Social Determinants of Health   Financial Resource Strain: Not on file  Food Insecurity: Not on file  Transportation Needs: Not on file  Physical Activity: Not on file  Stress: Not on file  Social Connections: Not on file  Intimate Partner Violence: Not on file    Outpatient Medications Prior to Visit  Medication Sig Dispense Refill  . OVER THE COUNTER MEDICATION D-Ribosome Citrulline Grape Seed Extract Bilberry Tribulus    . sildenafil (VIAGRA) 100 MG tablet Take 100 mg by mouth daily as needed for  erectile dysfunction.    Marland Kitchen ketoconazole (NIZORAL) 2 % cream   5  . modafinil (PROVIGIL) 100 MG tablet Take 1 tablet (100 mg total) daily by mouth. 30 tablet 0   No facility-administered medications prior to visit.    Allergies  Allergen Reactions  . Amoxicillin Hives  . Levaquin [Levofloxacin In D5w] Other (See Comments)    Blurry vision after two doses taken     Review of Systems     Objective:    Physical Exam  BP 133/78   Pulse 62   Temp 98.8 F (37.1 C)   Resp 18   Ht _0  (1.803 m)   Wt 232 lb (105.2 kg)   SpO2 97%   BMI 32.36 kg/m  Wt Readings from Last 3 Encounters:  11/09/20 232 lb (105.2 kg)  04/11/17 228 lb 12 oz (103.8 kg)  02/27/17 222 lb 12 oz (101 kg)    Health Maintenance Due  Topic Date Due  . Pneumococcal Vaccine 54-34 Years old (1 of 4 - PCV13) Never done  . COVID-19 Vaccine (1) Never  done  . HIV Screening  Never done  . Hepatitis C Screening  Never done  . TETANUS/TDAP  Never done  . COLONOSCOPY (Pts 45-59yr Insurance coverage will need to be confirmed)  Never done  . Zoster Vaccines- Shingrix (1 of 2) Never done    There are no preventive care reminders to display for this patient.   Lab Results  Component Value Date   TSH 2.070 12/20/2016   Lab Results  Component Value Date   WBC 8.2 12/20/2016   HGB 15.4 12/20/2016   HCT 45.5 12/20/2016   MCV 91 12/20/2016   PLT 287 12/20/2016   Lab Results  Component Value Date   NA 139 11/15/2016   K 3.4 (L) 11/15/2016   CO2 26 11/15/2016   GLUCOSE 110 (H) 11/15/2016   BUN 16 11/15/2016   CREATININE 0.86 11/15/2016   BILITOT 1.0 11/15/2016   ALKPHOS 42 11/15/2016   AST 16 11/15/2016   ALT 15 (L) 11/15/2016   PROT 7.0 11/15/2016   ALBUMIN 4.2 11/15/2016   CALCIUM 9.4 11/15/2016   ANIONGAP 8 11/15/2016   Lab Results  Component Value Date   CHOL 176 05/04/2016   Lab Results  Component Value Date   HDL 60 05/04/2016   Lab Results  Component Value Date   LDLCALC 102 (H) 05/04/2016   Lab Results  Component Value Date   TRIG 70 05/04/2016   Lab Results  Component Value Date   CHOLHDL 2.9 05/04/2016   No results found for: HGBA1C     Assessment & Plan:   Problem List Items Addressed This Visit      Musculoskeletal and Integument   Nonvenomous insect bite of neck    -had a lesion to back of neck and he states it had 2 bite marks in the center of it and had bullseye rash -will check lyme titer      Relevant Orders   Lyme Ab/Western Blot Reflex     Genitourinary   Hematuria, microscopic    -he would like U/A with next set of labs to see if this resolved        Other   Daytime somnolence    -takes modafinil; [place]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      Breast mass, right    -he found this  earlier in the year in  [place] (he guesses maybe in Feb 2020) -ordered imaging today      Relevant Orders   MM Digital Diagnostic Bilat   US BREAST COMPLETE UNI RIGHT INC AXILLA   Encounter to establish care - Primary    -obtain records      Relevant Orders   CBC with Differential/Platelet   CMP14+EGFR   Lipid Panel With LDL/HDL Ratio   TSH   VITAMIN D 25 Hydroxy (Vit-D Deficiency, Fractures)   Decreased libido    -he states that he has decreased libido and would like urology referral -he states this occurred after he had a varicocele -will honor that request      Relevant Orders   Ambulatory referral to Urology   Screening due    -will screen for HCV and HIV with next labs -he would like cologuard testing for colon CA screening; his father had colon cancer in his 40s -we discussed that cologuard is not for high risk patients (with family hx of colon CA) and that colonoscopy would be the preferred method of colon CA screening, but he insists that he would rather have cologuard -cologuard ordered      Relevant Orders   HIV Antibody (routine testing w rflx)   Hepatitis C antibody   Vitamin D deficiency    -will check Vit D with next labs      Relevant Orders   VITAMIN D 25 Hydroxy (Vit-D Deficiency, Fractures)       No orders of the defined types were placed in this encounter.    Noreene Larsson, NP

## 2020-11-09 NOTE — Assessment & Plan Note (Signed)
-  he found this earlier in the year in [place] (he guesses maybe in Feb 2020) -ordered imaging today

## 2020-11-09 NOTE — Assessment & Plan Note (Signed)
-  will check Vit D with next labs

## 2020-11-09 NOTE — Assessment & Plan Note (Signed)
-  obtain records

## 2020-11-09 NOTE — Assessment & Plan Note (Signed)
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

## 2020-11-09 NOTE — Patient Instructions (Signed)
Please have fasting labs drawn 2-3 days prior to your appointment so we can discuss the results during your office visit.

## 2020-11-28 ENCOUNTER — Ambulatory Visit: Payer: Managed Care, Other (non HMO) | Admitting: Nurse Practitioner

## 2020-12-06 ENCOUNTER — Other Ambulatory Visit: Payer: Self-pay

## 2020-12-06 ENCOUNTER — Ambulatory Visit: Payer: Managed Care, Other (non HMO) | Admitting: Nurse Practitioner

## 2020-12-06 ENCOUNTER — Encounter: Payer: Self-pay | Admitting: Nurse Practitioner

## 2020-12-06 DIAGNOSIS — W57XXXA Bitten or stung by nonvenomous insect and other nonvenomous arthropods, initial encounter: Secondary | ICD-10-CM | POA: Diagnosis not present

## 2020-12-06 DIAGNOSIS — S1096XA Insect bite of unspecified part of neck, initial encounter: Secondary | ICD-10-CM | POA: Diagnosis not present

## 2020-12-06 DIAGNOSIS — R3129 Other microscopic hematuria: Secondary | ICD-10-CM

## 2020-12-06 NOTE — Assessment & Plan Note (Signed)
-  has urology appointment scheduled

## 2020-12-06 NOTE — Progress Notes (Signed)
Established Patient Office Visit  Subjective:  Patient ID: Richard Fisher, male    DOB: Jul 14, 1970  Age: 50 y.o. MRN: [id number]  CC:  Chief Complaint  Patient presents with   Follow-up    HPI Richard Fisher presents for lab follow-up. He was supposed to have TSH, lyme, and vit D drawn, but he didn't have labs drawn and hasn't had imaging completed.  He states the U/S and mammogram have been scheduled, and he has urology appointment on the same day.  No acute concerns today. He will have labs drawn now.  Past Medical History:  Diagnosis Date   Anxiety    Fatigue    Melanoma ([place])    stage 0 melanoma 05/2015/ followed by Dr. Magnus Sinning   Melanoma of skin [facility]) 02/27/2017    Past Surgical History:  Procedure Laterality Date   MELANOMA EXCISION     followed by Dr. Magnus Sinning in [facility]    Family History  Problem Relation Age of Onset   Cancer Sister    Cholecystitis Brother    Thyroid disease Neg Hx        His mother had transient hypothyroidism    Social History   Socioeconomic History   Marital status: Divorced    Spouse name: Not on file   Number of children: 4   Years of education: BS   Highest education level: Not on file  Occupational History   Occupation: Self-emplyed    Comment: Gaffer  Tobacco Use   Smoking status: Never   Smokeless tobacco: Never  Vaping Use   Vaping Use: Never used  Substance and Sexual Activity   Alcohol use: No    Comment: maybe a beer ever 2-3 months   Drug use: Yes    Types: Marijuana    Comment: occasionally when in a MJ-friendly state; last use over a year ago   Sexual activity: Not Currently  Other Topics Concern   Not on file  Social History Narrative   Drinks 1 caffeine drink a day. Getting married next year. Moved here 3 years ago to be near the mother of daughter. Works as Occupational psychologist.    Grew up in [place].    Social Determinants of Health   Financial Resource Strain: Not on  file  Food Insecurity: Not on file  Transportation Needs: Not on file  Physical Activity: Not on file  Stress: Not on file  Social Connections: Not on file  Intimate Partner Violence: Not on file    Outpatient Medications Prior to Visit  Medication Sig Dispense Refill   OVER THE COUNTER MEDICATION D-Ribosome Citrulline Grape Seed Extract Bilberry Tribulus     sildenafil (VIAGRA) 100 MG tablet Take 100 mg by mouth daily as needed for erectile dysfunction.     No facility-administered medications prior to visit.    Allergies  Allergen Reactions   Amoxicillin Hives   Levaquin [Levofloxacin In D5w] Other (See Comments)    Blurry vision after two doses taken     ROS Review of Systems  Constitutional: Negative.   Respiratory: Negative.    Cardiovascular: Negative.   Psychiatric/Behavioral: Negative.       Objective:    Physical Exam Constitutional:      Appearance: Normal appearance.  Cardiovascular:     Rate and Rhythm: Normal rate and regular rhythm.     Pulses: Normal pulses.     Heart sounds: Normal heart sounds.  Pulmonary:     Effort: Pulmonary effort is  normal.     Breath sounds: Normal breath sounds.  Neurological:     Mental Status: He is alert.  Psychiatric:        Mood and Affect: Mood normal.        Behavior: Behavior normal.        Thought Content: Thought content normal.        Judgment: Judgment normal.    BP (!) 145/82 (BP Location: Left Arm, Patient Position: Sitting, Cuff Size: Large)   Pulse (!) 56   Temp 98.6 F (37 C) (Temporal)   Ht 5' 11"  (1.803 m)   Wt 234 lb (106.1 kg)   SpO2 95%   BMI 32.64 kg/m  Wt Readings from Last 3 Encounters:  12/06/20 234 lb (106.1 kg)  11/09/20 232 lb (105.2 kg)  04/11/17 228 lb 12 oz (103.8 kg)     Health Maintenance Due  Topic Date Due   HIV Screening  Never done   Hepatitis C Screening  Never done   COLONOSCOPY (Pts 45-50yr Insurance coverage will need to be confirmed)  Never done    There  are no preventive care reminders to display for this patient.  Lab Results  Component Value Date   TSH 2.070 12/20/2016   Lab Results  Component Value Date   WBC 8.2 12/20/2016   HGB 15.4 12/20/2016   HCT 45.5 12/20/2016   MCV 91 12/20/2016   PLT 287 12/20/2016   Lab Results  Component Value Date   NA 139 11/15/2016   K 3.4 (L) 11/15/2016   CO2 26 11/15/2016   GLUCOSE 110 (H) 11/15/2016   BUN 16 11/15/2016   CREATININE 0.86 11/15/2016   BILITOT 1.0 11/15/2016   ALKPHOS 42 11/15/2016   AST 16 11/15/2016   ALT 15 (L) 11/15/2016   PROT 7.0 11/15/2016   ALBUMIN 4.2 11/15/2016   CALCIUM 9.4 11/15/2016   ANIONGAP 8 11/15/2016   Lab Results  Component Value Date   CHOL 176 05/04/2016   Lab Results  Component Value Date   HDL 60 05/04/2016   Lab Results  Component Value Date   LDLCALC 102 (H) 05/04/2016   Lab Results  Component Value Date   TRIG 70 05/04/2016   Lab Results  Component Value Date   CHOLHDL 2.9 05/04/2016   No results found for: HGBA1C    Assessment & Plan:   Problem List Items Addressed This Visit       Musculoskeletal and Integument   Nonvenomous insect bite of neck    -neck lesion has healed -Lyme to be drawn today         Genitourinary   Hematuria, microscopic    -has urology appointment scheduled        No orders of the defined types were placed in this encounter.   Follow-up: Return in about 7 months (around 07/09/2021) for Physical Exam (same-day fasting labs).    JNoreene Larsson NP

## 2020-12-06 NOTE — Patient Instructions (Signed)
Please have labs drawn today.  We will meet up for a physical in several months. If your labs need to be addressed with an appointment, we will set up an expedited appointment.

## 2020-12-06 NOTE — Assessment & Plan Note (Signed)
-  neck lesion has healed -Lyme to be drawn today

## 2020-12-07 NOTE — Progress Notes (Signed)
LDL, his bad cholesterol, is slightly elevated. Cut back on fried/fatty foods, and we will recheck his lipids on his next set of labs. Not so high that we need to start medication now.

## 2020-12-08 LAB — LIPID PANEL WITH LDL/HDL RATIO
Cholesterol, Total: 182 mg/dL (ref 100–199)
HDL: 53 mg/dL (ref >39)
LDL Chol Calc (NIH): 115 mg/dL — ABNORMAL HIGH (ref 0–99)
LDL/HDL Ratio: 2.2 ratio (ref 0.0–3.6)
Triglycerides: 74 mg/dL (ref 0–149)
VLDL Cholesterol Cal: 14 mg/dL (ref 5–40)

## 2020-12-08 LAB — CMP14+EGFR
ALT: 24 IU/L (ref 0–44)
AST: 23 IU/L (ref 0–40)
Albumin/Globulin Ratio: 2 (ref 1.2–2.2)
Albumin: 4.6 g/dL (ref 4.0–5.0)
Alkaline Phosphatase: 71 IU/L (ref 44–121)
BUN/Creatinine Ratio: 10 (ref 9–20)
BUN: 11 mg/dL (ref 6–24)
Bilirubin Total: 1.3 mg/dL — ABNORMAL HIGH (ref 0.0–1.2)
CO2: 24 mmol/L (ref 20–29)
Calcium: 9.5 mg/dL (ref 8.7–10.2)
Chloride: 103 mmol/L (ref 96–106)
Creatinine, Ser: 1.09 mg/dL (ref 0.76–1.27)
Globulin, Total: 2.3 g/dL (ref 1.5–4.5)
Glucose: 85 mg/dL (ref 65–99)
Potassium: 4.3 mmol/L (ref 3.5–5.2)
Sodium: 139 mmol/L (ref 134–144)
Total Protein: 6.9 g/dL (ref 6.0–8.5)
eGFR: 83 mL/min/{1.73_m2} (ref >59)

## 2020-12-08 LAB — CBC WITH DIFFERENTIAL/PLATELET
Basophils Absolute: 0.1 10*3/uL (ref 0.0–0.2)
Basos: 1 %
EOS (ABSOLUTE): 0.3 10*3/uL (ref 0.0–0.4)
Eos: 3 %
Hematocrit: 47 % (ref 37.5–51.0)
Hemoglobin: 15.7 g/dL (ref 13.0–17.7)
Immature Grans (Abs): 0.1 10*3/uL (ref 0.0–0.1)
Immature Granulocytes: 1 %
Lymphocytes Absolute: 2.4 10*3/uL (ref 0.7–3.1)
Lymphs: 31 %
MCH: 30 pg (ref 26.6–33.0)
MCHC: 33.4 g/dL (ref 31.5–35.7)
MCV: 90 fL (ref 79–97)
Monocytes Absolute: 0.6 10*3/uL (ref 0.1–0.9)
Monocytes: 7 %
Neutrophils Absolute: 4.4 10*3/uL (ref 1.4–7.0)
Neutrophils: 57 %
Platelets: 254 10*3/uL (ref 150–450)
RBC: 5.23 x10E6/uL (ref 4.14–5.80)
RDW: 12.5 % (ref 11.6–15.4)
WBC: 7.9 10*3/uL (ref 3.4–10.8)

## 2020-12-08 LAB — HIV ANTIBODY (ROUTINE TESTING W REFLEX): HIV Screen 4th Generation wRfx: NONREACTIVE

## 2020-12-08 LAB — VITAMIN D 25 HYDROXY (VIT D DEFICIENCY, FRACTURES): Vit D, 25-Hydroxy: 39.6 ng/mL (ref 30.0–100.0)

## 2020-12-08 LAB — LYME DISEASE SEROLOGY W/REFLEX: Lyme Total Antibody EIA: NEGATIVE

## 2020-12-08 LAB — HEPATITIS C ANTIBODY: Hep C Virus Ab: <0.1 s/co ratio (ref 0.0–0.9)

## 2020-12-08 LAB — TSH: TSH: 2.2 u[IU]/mL (ref 0.450–4.500)

## 2020-12-13 ENCOUNTER — Ambulatory Visit (HOSPITAL_COMMUNITY)
Admission: RE | Admit: 2020-12-13 | Discharge: 2020-12-13 | Disposition: A | Discharge status: Home / Self Care | Payer: Managed Care, Other (non HMO) | Source: Ambulatory Visit | Attending: Nurse Practitioner | Admitting: Nurse Practitioner

## 2020-12-13 ENCOUNTER — Other Ambulatory Visit: Payer: Self-pay

## 2020-12-13 DIAGNOSIS — N62 Hypertrophy of breast: Secondary | ICD-10-CM

## 2020-12-13 DIAGNOSIS — N6310 Unspecified lump in the right breast, unspecified quadrant: Secondary | ICD-10-CM | POA: Insufficient documentation

## 2020-12-13 NOTE — Progress Notes (Signed)
Mammogram was negative for malignancy, but did show gynecomastia, or enlarged breast tissue. Would he like to see endocrinology to determine why he has enlargement of his breast tissue?

## 2020-12-20 ENCOUNTER — Encounter (HOSPITAL_COMMUNITY): Payer: Managed Care, Other (non HMO)

## 2020-12-20 ENCOUNTER — Ambulatory Visit (HOSPITAL_COMMUNITY): Payer: Managed Care, Other (non HMO)

## 2020-12-20 ENCOUNTER — Ambulatory Visit: Payer: Managed Care, Other (non HMO) | Admitting: Urology

## 2021-02-01 ENCOUNTER — Ambulatory Visit: Payer: BLUE CROSS/BLUE SHIELD | Admitting: "Endocrinology

## 2021-02-21 ENCOUNTER — Ambulatory Visit: Payer: Managed Care, Other (non HMO) | Admitting: Urology

## 2021-02-21 DIAGNOSIS — R6882 Decreased libido: Secondary | ICD-10-CM

## 2021-07-10 ENCOUNTER — Encounter: Payer: Managed Care, Other (non HMO) | Admitting: Nurse Practitioner
